# Patient Record
Sex: Male | Born: 1970 | Race: White | Hispanic: Yes | Marital: Married | Smoking: Never smoker
Health system: Southern US, Community
[De-identification: ages and names within clinical notes are randomized; demographics above are authoritative.]

## PROBLEM LIST (undated history)

## (undated) ENCOUNTER — Ambulatory Visit: Admission: EM | Source: Home / Self Care

## (undated) DIAGNOSIS — K603 Anal fistula, unspecified: Secondary | ICD-10-CM

---

## 2007-12-09 ENCOUNTER — Emergency Department (HOSPITAL_COMMUNITY): Admission: EM | Admit: 2007-12-09 | Discharge: 2007-12-09 | Payer: Self-pay | Admitting: Emergency Medicine

## 2007-12-30 ENCOUNTER — Emergency Department (HOSPITAL_COMMUNITY): Admission: EM | Admit: 2007-12-30 | Discharge: 2007-12-30 | Payer: Self-pay | Admitting: Emergency Medicine

## 2008-01-11 ENCOUNTER — Encounter: Admission: RE | Admit: 2008-01-11 | Discharge: 2008-01-11 | Payer: Self-pay | Admitting: Chiropractic Medicine

## 2010-03-11 ENCOUNTER — Emergency Department (HOSPITAL_COMMUNITY)
Admission: EM | Admit: 2010-03-11 | Discharge: 2010-03-11 | Disposition: A | Discharge status: Home / Self Care | Payer: Self-pay | Attending: Emergency Medicine | Admitting: Emergency Medicine

## 2010-03-11 DIAGNOSIS — R51 Headache: Secondary | ICD-10-CM | POA: Insufficient documentation

## 2010-03-11 DIAGNOSIS — J329 Chronic sinusitis, unspecified: Secondary | ICD-10-CM | POA: Insufficient documentation

## 2010-03-30 ENCOUNTER — Emergency Department (HOSPITAL_COMMUNITY)
Admission: EM | Admit: 2010-03-30 | Discharge: 2010-03-30 | Disposition: A | Discharge status: Home / Self Care | Payer: Self-pay | Attending: Emergency Medicine | Admitting: Emergency Medicine

## 2010-03-30 DIAGNOSIS — J329 Chronic sinusitis, unspecified: Secondary | ICD-10-CM | POA: Insufficient documentation

## 2010-03-30 DIAGNOSIS — R51 Headache: Secondary | ICD-10-CM | POA: Insufficient documentation

## 2010-03-30 DIAGNOSIS — R220 Localized swelling, mass and lump, head: Secondary | ICD-10-CM | POA: Insufficient documentation

## 2010-03-30 DIAGNOSIS — J3489 Other specified disorders of nose and nasal sinuses: Secondary | ICD-10-CM | POA: Insufficient documentation

## 2010-04-03 IMAGING — CR DG WRIST COMPLETE 3+V*L*
4 series · 4 of 4 positions shown · non-contrast
Comparison: 12/09/2007

CLINICAL DATA: Injury to left wrist.

LEFT WRIST - COMPLETE 3+ VIEW

[x wrist pa left]
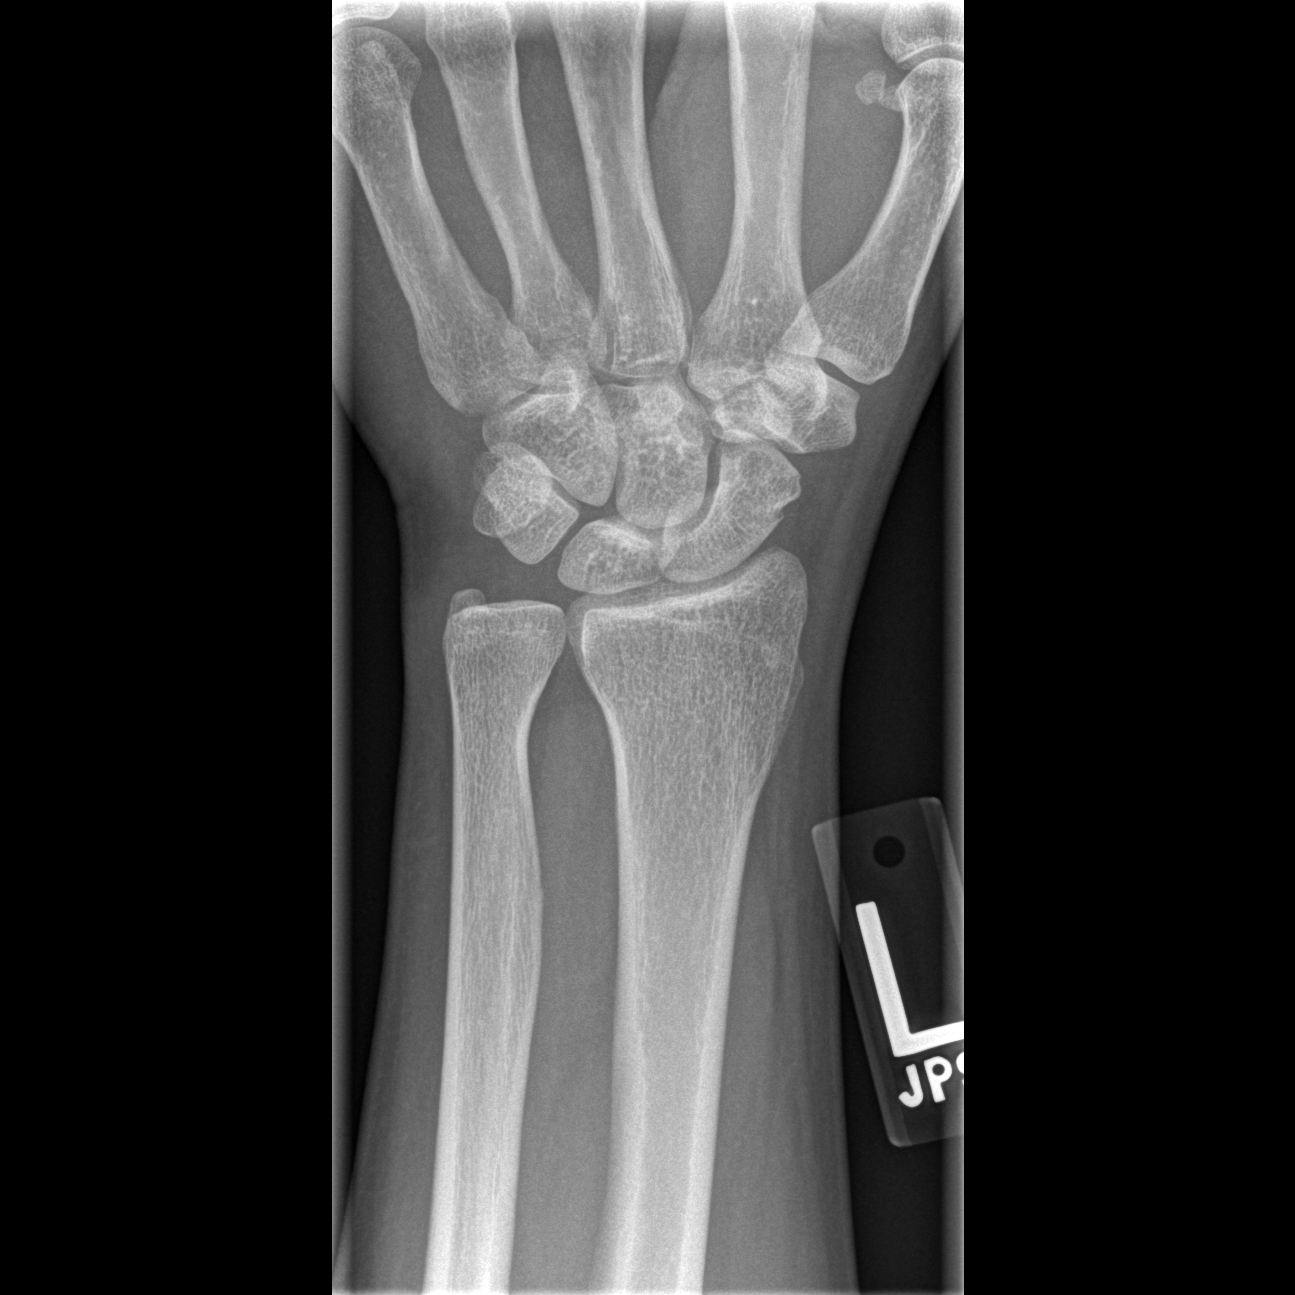

[x wrist obl left]
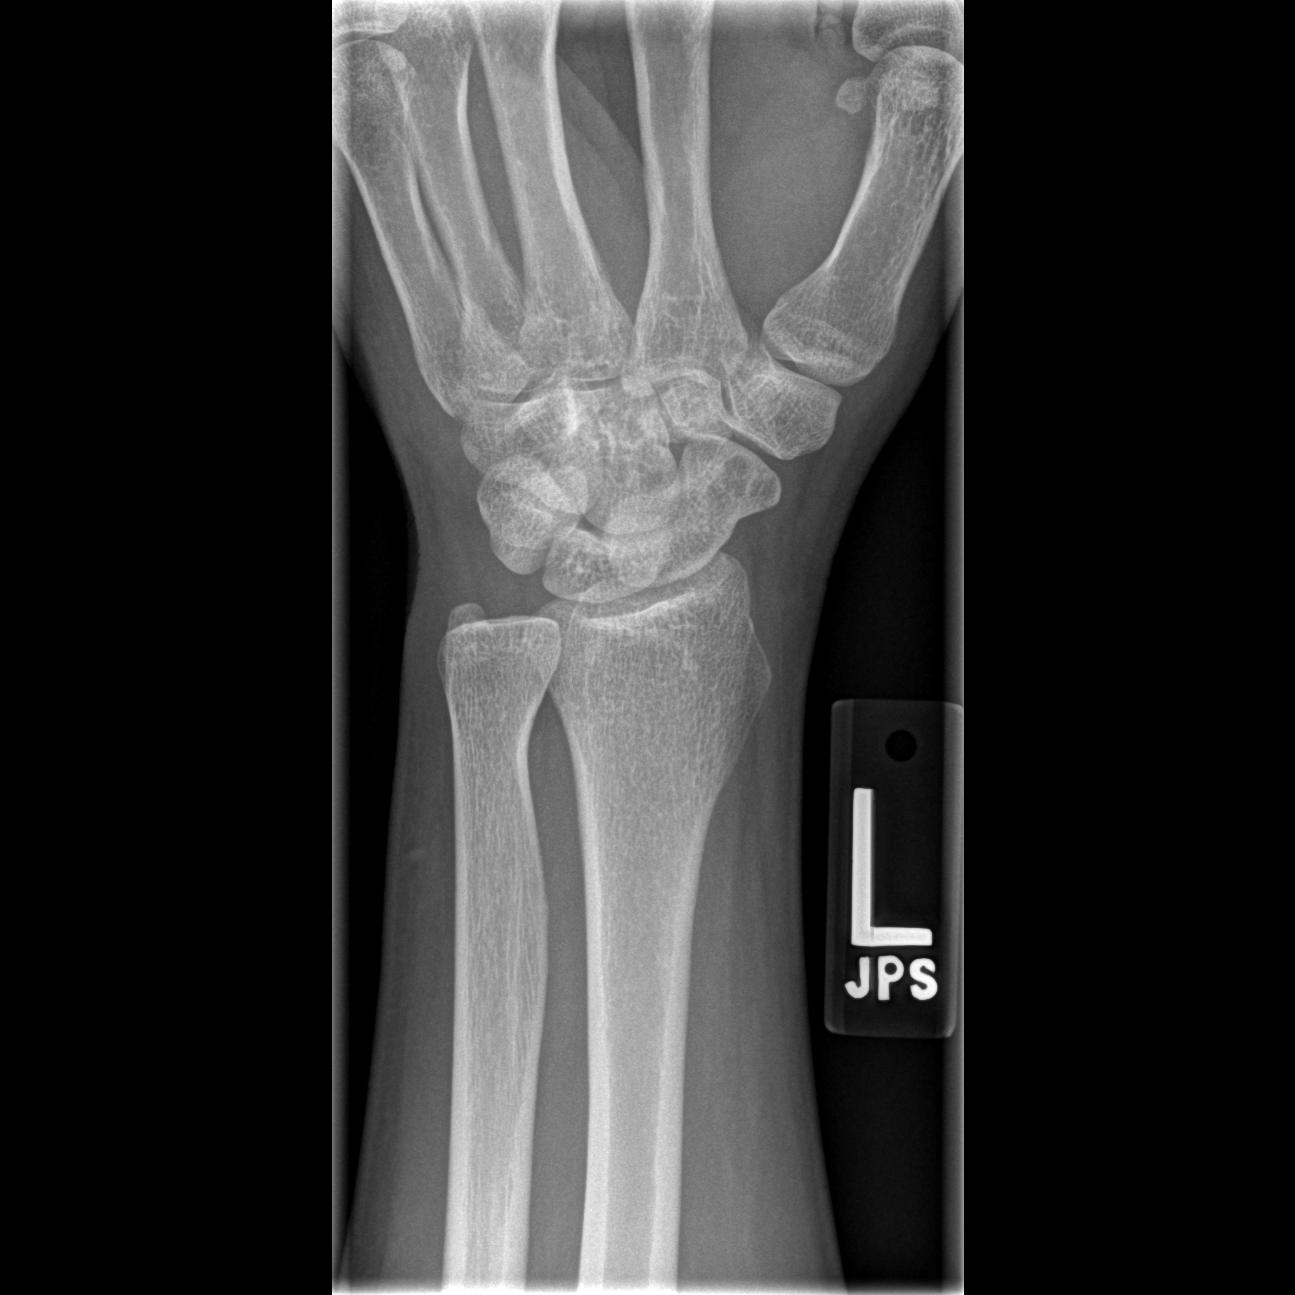

[x wrist lat left]
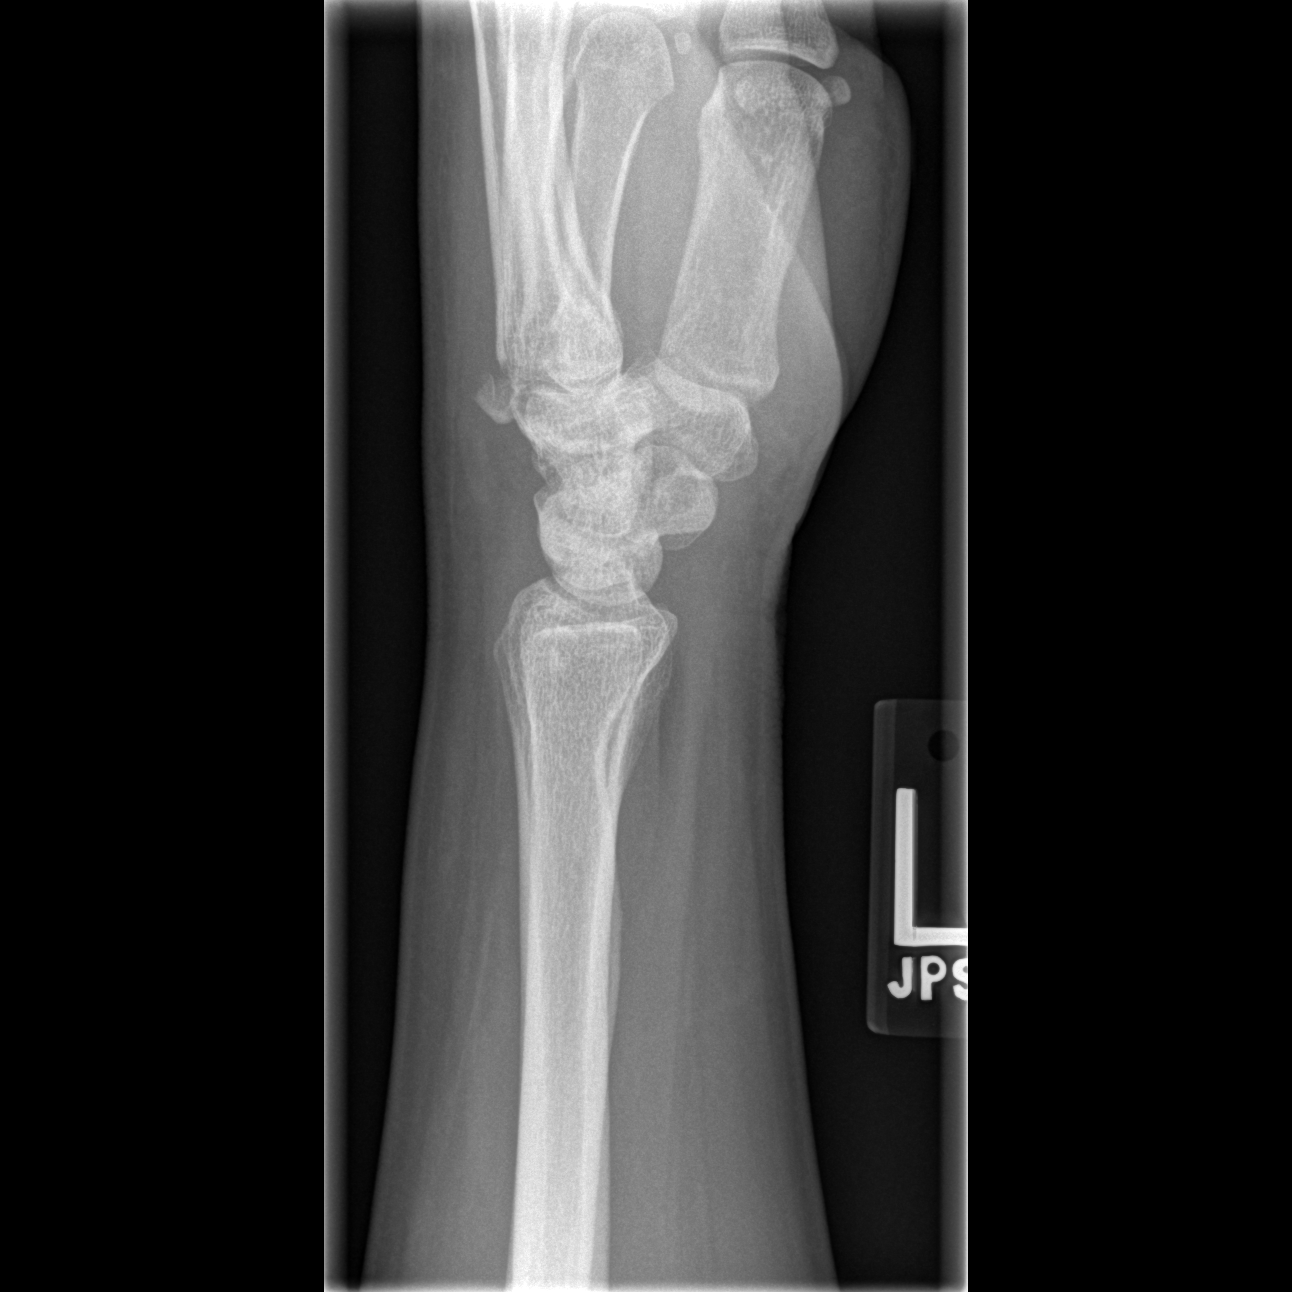

[x navicular]
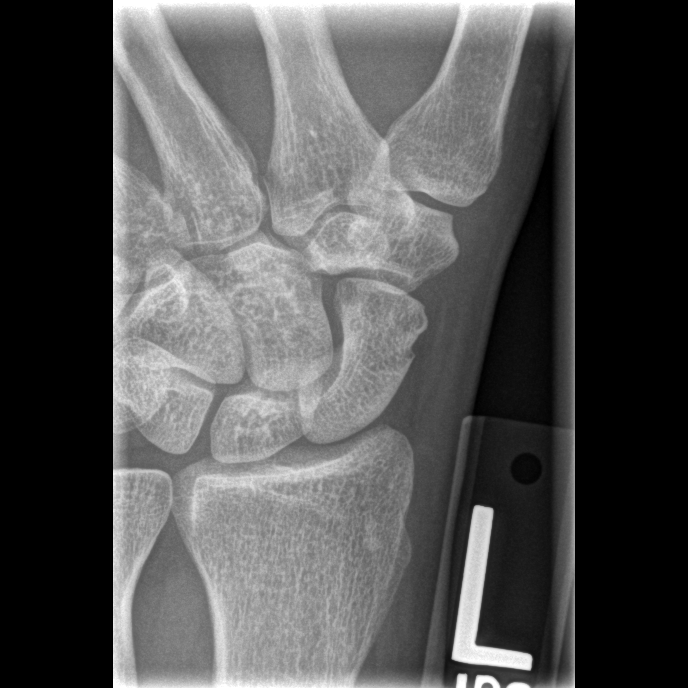

[4 of 4 positions shown; findings below may reference images not displayed]

FINDINGS: No evidence of acute fracture, subluxation or dislocation
identified.

No radio-opaque foreign bodies are present.

No focal bony lesions are noted.

There has been little interval change since the prior study.
IMPRESSION: No evidence of acute bony abnormality.

## 2010-05-21 ENCOUNTER — Emergency Department (HOSPITAL_COMMUNITY): Payer: Self-pay

## 2010-05-21 ENCOUNTER — Emergency Department (HOSPITAL_COMMUNITY)
Admission: EM | Admit: 2010-05-21 | Discharge: 2010-05-21 | Disposition: A | Discharge status: Home / Self Care | Payer: Self-pay | Attending: Emergency Medicine | Admitting: Emergency Medicine

## 2010-05-21 DIAGNOSIS — K089 Disorder of teeth and supporting structures, unspecified: Secondary | ICD-10-CM | POA: Insufficient documentation

## 2010-05-21 DIAGNOSIS — R51 Headache: Secondary | ICD-10-CM | POA: Insufficient documentation

## 2010-05-21 DIAGNOSIS — J329 Chronic sinusitis, unspecified: Secondary | ICD-10-CM | POA: Insufficient documentation

## 2010-10-22 LAB — URINALYSIS, ROUTINE W REFLEX MICROSCOPIC
Glucose, UA: NEGATIVE
Ketones, ur: NEGATIVE
Nitrite: NEGATIVE
Specific Gravity, Urine: 1.013

## 2010-10-22 LAB — POCT I-STAT, CHEM 8
BUN: 7
Calcium, Ion: 1.2
HCT: 47
Potassium: 4

## 2010-10-24 LAB — COMPREHENSIVE METABOLIC PANEL
CO2: 26 mEq/L (ref 19–32)
Calcium: 9.7 mg/dL (ref 8.4–10.5)
Sodium: 139 mEq/L (ref 135–145)
Total Bilirubin: 0.8 mg/dL (ref 0.3–1.2)
Total Protein: 7.8 g/dL (ref 6.0–8.3)

## 2010-10-24 LAB — DIFFERENTIAL
Basophils Absolute: 0 10*3/uL (ref 0.0–0.1)
Basophils Relative: 0 % (ref 0–1)
Eosinophils Absolute: 0.1 K/uL (ref 0.0–0.7)
Eosinophils Relative: 1 % (ref 0–5)
Lymphocytes Relative: 22 % (ref 12–46)
Lymphs Abs: 1.9 10*3/uL (ref 0.7–4.0)
Monocytes Absolute: 0.6 10*3/uL (ref 0.1–1.0)
Monocytes Relative: 7 % (ref 3–12)
Neutro Abs: 6 10*3/uL (ref 1.7–7.7)
Neutrophils Relative %: 69 % (ref 43–77)

## 2010-10-24 LAB — CBC
HCT: 48.8 % (ref 39.0–52.0)
Hemoglobin: 16.9 g/dL (ref 13.0–17.0)
MCHC: 34.6 g/dL (ref 30.0–36.0)
MCV: 91.3 fL (ref 78.0–100.0)
Platelets: 247 K/uL (ref 150–400)
RBC: 5.34 MIL/uL (ref 4.22–5.81)
RDW: 12.4 % (ref 11.5–15.5)
WBC: 8.6 10*3/uL (ref 4.0–10.5)

## 2010-10-24 LAB — URINALYSIS, ROUTINE W REFLEX MICROSCOPIC
Bilirubin Urine: NEGATIVE
Glucose, UA: NEGATIVE mg/dL
Ketones, ur: NEGATIVE mg/dL
Nitrite: NEGATIVE
Specific Gravity, Urine: 1.02 (ref 1.005–1.030)
pH: 6 (ref 5.0–8.0)

## 2015-05-08 ENCOUNTER — Ambulatory Visit (INDEPENDENT_AMBULATORY_CARE_PROVIDER_SITE_OTHER): Payer: Self-pay | Admitting: Physician Assistant

## 2015-05-08 VITALS — BP 126/100 | HR 68 | Temp 98.0°F | Resp 16 | Ht 66.0 in | Wt 207.8 lb

## 2015-05-08 DIAGNOSIS — D649 Anemia, unspecified: Secondary | ICD-10-CM

## 2015-05-08 DIAGNOSIS — K625 Hemorrhage of anus and rectum: Secondary | ICD-10-CM

## 2015-05-08 LAB — POCT CBC
GRANULOCYTE PERCENT: 66.2 % (ref 37–80)
HEMATOCRIT: 42.7 % — AB (ref 43.5–53.7)
Hemoglobin: 15.5 g/dL (ref 14.1–18.1)
Lymph, poc: 2.1 (ref 0.6–3.4)
MCH: 31.9 pg — AB (ref 27–31.2)
MCHC: 36.3 g/dL — AB (ref 31.8–35.4)
MCV: 87.9 fL (ref 80–97)
MID (cbc): 0.6 (ref 0–0.9)
MPV: 7.6 fL (ref 0–99.8)
PLATELET COUNT, POC: 179 10*3/uL (ref 142–424)
POC GRANULOCYTE: 5.2 (ref 2–6.9)
POC LYMPH %: 26.5 % (ref 10–50)
POC MID %: 7.3 %M (ref 0–12)
RBC: 4.85 M/uL (ref 4.69–6.13)
RDW, POC: 12.7 %
WBC: 7.8 10*3/uL (ref 4.6–10.2)

## 2015-05-08 LAB — POCT SEDIMENTATION RATE: POCT SED RATE: 16 mm/h (ref 0–22)

## 2015-05-08 MED ORDER — HYDROCORTISONE ACETATE 25 MG RE SUPP: 25.0000 mg | Freq: Two times a day (BID) | RECTAL | Status: DC

## 2015-05-08 NOTE — Patient Instructions (Signed)
     IF you received an x-ray today, you will receive an invoice from [facility]. Please contact [facility] at [phone] with questions or concerns regarding your invoice.   IF you received labwork today, you will receive an invoice from [facility]. Please contact [facility] at [phone] with questions or concerns regarding your invoice.   Our billing staff will not be able to assist you with questions regarding bills from these companies.  You will be contacted with the lab results as soon as they are available. The fastest way to get your results is to activate your My Chart account. Instructions are located on the last page of this paperwork. If you have not heard from us regarding the results in 2 weeks, please contact this office.      

## 2015-05-08 NOTE — Progress Notes (Signed)
05/08/2015 4:58 PM   DOB: 11/06/1970 / MRN: [id number]  SUBJECTIVE:  Sharlyn BolognaJose E Espitia is a 45 y.o. self pay male presenting for rectal bleeding that started three days ago. Reports the both the paper and the toilet bowel are red after defecating, and states his defecation has increased to 10 times per day. Associates pain with defecation.  He denies a recent history of constipation.  Denies taking any NSAIDS in the last month.  Denies heavy alcohol consumption. Denies nausea, abdominal pain, and dysuria.  He is a non smoker.   He has No Known Allergies.   He  has no past medical history on file.    He  reports that he has never smoked. He does not have any smokeless tobacco history on file. He reports that he does not drink alcohol. He  has no sexual activity history on file. The patient  has no past surgical history on file.  His family history is not on file.  Review of Systems  Constitutional: Negative for fever.  Gastrointestinal: Positive for blood in stool. Negative for heartburn, nausea, vomiting, abdominal pain, diarrhea, constipation and melena.  Skin: Negative for rash.  Neurological: Negative for dizziness.    Problem list and medications reviewed and updated by myself where necessary, and exist elsewhere in the encounter.   OBJECTIVE:  BP 126/100 mmHg  Pulse 68  Temp(Src) 98 F (36.7 C) (Oral)  Resp 16  Ht 5\' 6"  (1.676 m)  Wt 207 lb 12.8 oz (94.257 kg)  BMI 33.56 kg/m2  SpO2 98%  Physical Exam  Constitutional: He is oriented to person, place, and time. He appears well-developed. He does not appear ill.  Eyes: Conjunctivae and EOM are normal. Pupils are equal, round, and reactive to light.  Cardiovascular: Normal rate.   Pulmonary/Chest: Effort normal.  Abdominal: He exhibits no distension.  Genitourinary: Rectal exam shows tenderness. Rectal exam shows no external hemorrhoid, no internal hemorrhoid, no fissure, no mass and anal tone normal.     Musculoskeletal:  Normal range of motion.  Neurological: He is alert and oriented to person, place, and time. No cranial nerve deficit. Coordination normal.  Skin: Skin is warm and dry. He is not diaphoretic.  Psychiatric: He has a normal mood and affect.  Nursing note and vitals reviewed.   Results for orders placed or performed in visit on 05/08/15 (from the past 72 hour(s))  POCT CBC     Status: Abnormal   Collection Time: 05/08/15  4:25 PM  Result Value Ref Range   WBC 7.8 4.6 - 10.2 K/uL   Lymph, poc 2.1 0.6 - 3.4   POC LYMPH PERCENT 26.5 10 - 50 %L   MID (cbc) 0.6 0 - 0.9   POC MID % 7.3 0 - 12 %M   POC Granulocyte 5.2 2 - 6.9   Granulocyte percent 66.2 37 - 80 %G   RBC 4.85 4.69 - 6.13 M/uL   Hemoglobin 15.5 14.1 - 18.1 g/dL   HCT, POC 40.942.7 (A) 81.143.5 - 53.7 %   MCV 87.9 80 - 97 fL   MCH, POC 31.9 (A) 27 - 31.2 pg   MCHC 36.3 (A) 31.8 - 35.4 g/dL   RDW, POC 91.412.7 %   Platelet Count, POC 179 142 - 424 K/uL   MPV 7.6 0 - 99.8 fL    No results found.  ASSESSMENT AND PLAN  Elita QuickJose was seen today for blood in stool and anal pain.  Diagnoses and all orders for  this visit:  Rectal bleeding: He is a non smoker and drinks very little.  No NSAIDs in the last month.  His platelets are normal. He has been having frank hematochezia. He has rectal pain and his rectal bleeding is copious in the morning.  I can not see or palpate any hemorrhoid or fissure. Given his complaint of rectal pain I am inclined to think that this is not a concerning bleed.  I am checking a sed rate today and will recheck his CBC tomorrow to obtain a direction of his bleeding.  If he is improved with Anusol then I feel this is most likely a hemorrhoid that I can not appreciate.  If his CBC is dropping will stat him to GI for a scope.   -     POCT CBC -     POCT SEDIMENTATION RATE -     hydrocortisone (ANUSOL-HC) 25 MG suppository; Place 1 suppository (25 mg total) rectally 2 (two) times daily.  Anemia, unspecified anemia type: See  problem one.   -     POCT CBC; Future    The patient was advised to call or return to clinic if he does not see an improvement in symptoms or to seek the care of the closest emergency department if he worsens with the above plan.   Deliah Boston, MHS, PA-C [facility] 05/08/2015 4:58 PM

## 2015-05-09 ENCOUNTER — Ambulatory Visit: Payer: Self-pay | Admitting: Physician Assistant

## 2015-05-09 DIAGNOSIS — D649 Anemia, unspecified: Secondary | ICD-10-CM

## 2015-05-09 LAB — POCT CBC
GRANULOCYTE PERCENT: 60.5 % (ref 37–80)
HEMATOCRIT: 44.3 % (ref 43.5–53.7)
Hemoglobin: 16.2 g/dL (ref 14.1–18.1)
Lymph, poc: 2 (ref 0.6–3.4)
MCH: 32.1 pg — AB (ref 27–31.2)
MCHC: 36.5 g/dL — AB (ref 31.8–35.4)
MCV: 87.9 fL (ref 80–97)
MID (CBC): 0.6 (ref 0–0.9)
MPV: 7.6 fL (ref 0–99.8)
PLATELET COUNT, POC: 185 10*3/uL (ref 142–424)
POC GRANULOCYTE: 4 (ref 2–6.9)
POC LYMPH %: 31 % (ref 10–50)
POC MID %: 8.5 %M (ref 0–12)
RBC: 5.04 M/uL (ref 4.69–6.13)
RDW, POC: 12.2 %
WBC: 6.6 10*3/uL (ref 4.6–10.2)

## 2015-05-09 NOTE — Patient Instructions (Signed)
     IF you received an x-ray today, you will receive an invoice from [facility]. Please contact [facility] at [phone] with questions or concerns regarding your invoice.   IF you received labwork today, you will receive an invoice from [facility]. Please contact [facility] at [phone] with questions or concerns regarding your invoice.   Our billing staff will not be able to assist you with questions regarding bills from these companies.  You will be contacted with the lab results as soon as they are available. The fastest way to get your results is to activate your My Chart account. Instructions are located on the last page of this paperwork. If you have not heard from us regarding the results in 2 weeks, please contact this office.      

## 2015-05-09 NOTE — Progress Notes (Signed)
He is here for a blood draw only. CBC improved.  Advised he continue taking the anusol.  Will see him back in 1 week for an appointment. Deliah BostonMichael Dorsey Authement, MS, PA-C 12:46 PM, 05/09/2015

## 2015-07-24 ENCOUNTER — Ambulatory Visit (INDEPENDENT_AMBULATORY_CARE_PROVIDER_SITE_OTHER): Payer: Self-pay | Admitting: Family Medicine

## 2015-07-24 VITALS — BP 118/72 | HR 69 | Temp 97.8°F | Resp 18 | Ht 68.5 in | Wt 209.0 lb

## 2015-07-24 DIAGNOSIS — H6122 Impacted cerumen, left ear: Secondary | ICD-10-CM

## 2015-07-24 NOTE — Progress Notes (Addendum)
By signing my name below I, Shelah LewandowskyJoseph Thomas, attest that this documentation has been prepared under the direction and in the presence of Shade FloodJeffrey R Iniya Matzek, MD. Electonically Signed. Shelah LewandowskyJoseph Thomas, Scribe 07/24/2015 at 2:21 PM  Subjective:    Patient ID: Nathan Hickman, male    DOB: 12/05/1970, 45 y.o.   MRN: [id number]  Chief Complaint  Patient presents with  . Ear Problem    Left ear.     HPI Nathan Hickman is a 45 y.o. male who [facility] complaining of left ear ringing and decreased hearing that started yesterday and has been improving and not resolved. Pt was playing in a pool with his son yesterday and has difficulty hearing out of his left ear since then. Pt also describes a full feeling in left ear and thought there was water stuck in his ear. Pt used a que tip yesterday and removed some ear wax. Pt denies any ear pain while using que tip or afterwards. Pt's hearing was improved after the was was removed and the ringing was reduced.  Pt denies any fever, chills, or cough.     There are no active problems to display for this patient.  No past medical history on file. No past surgical history on file. No Known Allergies Prior to Admission medications   Not on File   Social History   Social History  . Marital Status: Married    Spouse Name: N/A  . Number of Children: N/A  . Years of Education: N/A   Occupational History  . Not on file.   Social History Main Topics  . Smoking status: Never Smoker   . Smokeless tobacco: Not on file  . Alcohol Use: No  . Drug Use: Not on file  . Sexual Activity: Not on file   Other Topics Concern  . Not on file   Social History Narrative      Review of Systems  Constitutional: Negative for fever and chills.  HENT: Positive for hearing loss (left ear). Negative for ear pain.   Respiratory: Negative for cough.        Objective:   Physical Exam  Constitutional: He is oriented to person,  place, and time. He appears well-developed and well-nourished. No distress.  HENT:  Head: Normocephalic and atraumatic.  Right Ear: Hearing, tympanic membrane, external ear and ear canal normal.  There is a small amount of yellow cerumen in the left proximal canal with one black hair, Can visualize part of left TM which does not appear to be ruptured. No tenderness with motion of pinna. There is no swelling, exudate, or redness of canal. Gross hearing intact.   Eyes: Conjunctivae are normal. Pupils are equal, round, and reactive to light.  Neck: Neck supple.  Cardiovascular: Normal rate.   Pulmonary/Chest: Effort normal.  Musculoskeletal: Normal range of motion.  Neurological: He is alert and oriented to person, place, and time.  Skin: Skin is warm and dry. No rash noted.  Psychiatric: He has a normal mood and affect. His behavior is normal.  Nursing note and vitals reviewed.    Filed Vitals:   07/24/15 1403  BP: 118/72  Pulse: 69  Temp: 97.8 F (36.6 C)  TempSrc: Oral  Resp: 18  Height: 5' 8.5" (1.74 m)  Weight: 209 lb (94.802 kg)  SpO2: 99%        Assessment & Plan:   Nathan Hickman is a 45 y.o. male Excessive cerumen  in ear canal, left  Suspected excess cerumen worsened by swimming. Appears to have cleared most of cerumen and blockage with a cotton tipped swab yesterday,, but did advise against using these in the future as risk of rupture or canal damage/infection. There was minimal cerumen and small amount of clear fluid at the proximal canal, without other signs of infection, edema, or apparent TM rupture on my exam. However advised if he is not improving the next 3-4 days, including with improvement in hearing, to return for recheck. Spanish and [place] spoken with understanding expressed.  No orders of the defined types were placed in this encounter.   Patient Instructions       IF you received an x-ray today, you will receive an invoice [facility]. Please contact [facility] at [phone] with questions or concerns regarding your invoice.   IF you received labwork today, you will receive an invoice from [facility]. Please contact [facility] at [phone] with questions or concerns regarding your invoice.   Our billing staff will not be able to assist you with questions regarding bills from these companies.  You will be contacted with the lab results as soon as they are available. The fastest way to get your results is to activate your My Chart account. Instructions are located on the last page of this paperwork. If you have not heard from us regarding the results in 2 weeks, please contact this office.     No mas Qtips, porque es posible rompio un parte de su oido. si no esta mejor en proximo 3-4 dias - regrese. regrese mas temprano si empeorse (dolor, mas ruido, o no oi bien).   Tapn de cerumen (Cerumen Impaction) Las estructuras del canal auditivo externo secretan una sustancia cerosa llamada cerumen. El exceso de cerumen puede acumularse en el canal auditivo y causar una afeccin conocida como tapn de cerumen. El tapn de cerumen puede producir dolor de odo y Media planneralterar el funcionamiento de este rgano. La velocidad de produccin del cerumen es diferente en cada persona. En algunas, la estructura del canal auditivo puede reducir su capacidad de eliminar el cerumen de forma natural. CAUSAS La causa del tapn de cerumen es el exceso de produccin o la acumulacin de esta secrecin. FACTORES DE RIESGO  Usar hisopos frecuentemente para limpiarse los odos.  Tener los canales TEPPCO Partnersauditivos estrechos.  Tener eccema.  Estar deshidratado. SIGNOS Y SNTOMAS  Disminucin de la audicin.  Secrecin del odo.  Dolor de odo.  Picazn en el odo. TRATAMIENTO El tratamiento puede incluir lo siguiente:  Gotas ticas de venta libre o recetadas para ablandar el cerumen.  Extraccin del  cerumen a cargo de Games developerun mdico. Esto se puede hacer de las siguientes maneras:  Lavado con agua tibia. Este es el mtodo de extraccin ms comn.  Cucharillas de raspado y otros instrumentos para el odo.  Ciruga. Esta puede realizarse Circuit Citycuando los casos son graves. INSTRUCCIONES PARA EL CUIDADO EN EL HOGAR  Tome los medicamentos solamente como se lo haya indicado el mdico.  No se introduzca objetos en el odo con la intencin de limpiarlo. PREVENCIN  No se introduzca objetos en el odo, aunque sea con la intencin de limpiarlo. No es necesario quitar el cerumen como parte de la higiene normal, y no se recomienda el uso de hisopos en el canal auditivo.  Beba suficiente agua para mantener la orina clara o de color amarillo plido.  Controle el eccema, si lo tiene. SOLICITE ATENCIN MDICA SI:  Tiene dolor de odo.  Le sangra el odo.  El cerumen no se elimina despus de colocarse gotas ticas como se lo indicaron.   Esta informacin no tiene Theme park manager el consejo del mdico. Asegrese de hacerle al mdico cualquier pregunta que tenga.   Document Released: 01/05/2005 Document Revised: 01/26/2014 Elsevier Interactive Patient Education [employer]      I personally performed the services described in this documentation, which was scribed in my presence. The recorded information has been reviewed and considered, and addended by me as needed.   Signed,   Meredith Staggers, MD [facility].  07/24/2015 2:31 PM

## 2015-07-24 NOTE — Patient Instructions (Addendum)
     IF you received an x-ray today, you will receive an invoice from [facility]. Please contact [facility] at [phone] with questions or concerns regarding [facility].   IF you received labwork today, you will receive an invoice from [facility]. Please contact [facility] at [phone] with questions or concerns regarding your invoice.   Our billing staff will not be able to assist you with questions regarding bills from these companies.  You will be contacted with the lab results as soon as they are available. The fastest way to get your results is to activate your My Chart account. Instructions are located on the last page of this paperwork. If you have not heard from us regarding the results in 2 weeks, please contact this office.     No mas Qtips, porque es posible rompio un parte de su oido. si no esta mejor en proximo 3-4 dias - regrese. regrese mas temprano si empeorse (dolor, mas ruido, o no oi bien).   Tapn de cerumen (Cerumen Impaction) Las estructuras del canal auditivo externo secretan una sustancia cerosa llamada cerumen. El exceso de cerumen puede acumularse en el canal auditivo y causar una afeccin conocida como tapn de cerumen. El tapn de cerumen puede producir dolor de odo y Media planneralterar el funcionamiento de este rgano. La velocidad de produccin del cerumen es diferente en cada persona. En algunas, la estructura del canal auditivo puede reducir su capacidad de eliminar el cerumen de forma natural. CAUSAS La causa del tapn de cerumen es el exceso de produccin o la acumulacin de esta secrecin. FACTORES DE RIESGO  Usar hisopos frecuentemente para limpiarse los odos.  Tener los canales TEPPCO Partnersauditivos estrechos.  Tener eccema.  Estar deshidratado. SIGNOS Y SNTOMAS  Disminucin de la audicin.  Secrecin del odo.  Dolor de odo.  Picazn en el odo. TRATAMIENTO El tratamiento puede incluir lo  siguiente:  Gotas ticas de venta libre o recetadas para ablandar el cerumen.  Extraccin del cerumen a cargo de Games developerun mdico. Esto se puede hacer de las siguientes maneras:  Lavado con agua tibia. Este es el mtodo de extraccin ms comn.  Cucharillas de raspado y otros instrumentos para el odo.  Ciruga. Esta puede realizarse [employer] Citycuando los casos son graves. INSTRUCCIONES PARA EL CUIDADO EN EL HOGAR  Tome los medicamentos solamente como se lo haya indicado el mdico.  No se introduzca objetos en el odo con la intencin de limpiarlo. PREVENCIN  No se introduzca objetos en el odo, aunque sea con la intencin de limpiarlo. No es necesario quitar el cerumen como parte de la higiene normal, y no se recomienda el uso de hisopos en el canal auditivo.  Beba suficiente agua para mantener la orina clara o de color amarillo plido.  Controle el eccema, si lo tiene. SOLICITE ATENCIN MDICA SI:  Tiene dolor de odo.  Le sangra el odo.  El cerumen no se elimina despus de colocarse gotas ticas como se lo indicaron.   Esta informacin no tiene Theme park managercomo fin reemplazar el consejo del mdico. Asegrese de hacerle al mdico cualquier pregunta que tenga.   Document Released: 01/05/2005 Document Revised: 01/26/2014 Elsevier Interactive Patient Education [employer]2016 Elsevier Inc.

## 2015-07-29 ENCOUNTER — Ambulatory Visit (INDEPENDENT_AMBULATORY_CARE_PROVIDER_SITE_OTHER): Payer: Self-pay | Admitting: Physician Assistant

## 2015-07-29 VITALS — BP 116/70 | HR 68 | Temp 97.7°F | Resp 18 | Ht 68.5 in | Wt 211.4 lb

## 2015-07-29 DIAGNOSIS — R635 Abnormal weight gain: Secondary | ICD-10-CM

## 2015-07-29 LAB — POCT GLYCOSYLATED HEMOGLOBIN (HGB A1C): HEMOGLOBIN A1C: 5.6

## 2015-07-29 LAB — TSH: TSH: 2.65 m[IU]/L (ref 0.40–4.50)

## 2015-07-29 LAB — GLUCOSE, POCT (MANUAL RESULT ENTRY): POC GLUCOSE: 91 mg/dL (ref 70–99)

## 2015-07-29 NOTE — Progress Notes (Signed)
[facility] [address], [place] [id number] [phone]  Date:  07/29/2015   Name:  Nathan Hickman   DOB:  07/09/1970   MRN:  [id number]  PCP:  No PCP Per Patient    History of Present Illness:  Nathan Hickman is a 45 y.o. male patient who [facility]   --running every day and was weighing 206lbs.  Lost 22 lbs 2 years ago.   --He is no longer exercising.   --Breakfast: cereal, and water.  H[place] CorkJimmy John, or 4 tacos.  No sodas, no sweet teas.  Red bull 1-2.  No desserts --painting.   --qd.  No blood in stool.  No constipation. --No diarrhea, no frequency, no nausea, no dizziness, no tremulousness.     Wt Readings from Last 3 Encounters:  07/29/15 211 lb 6.4 oz (95.89 kg)  07/24/15 209 lb (94.802 kg)  05/09/15 207 lb (93.895 kg)     There are no active problems to display for this patient.   History reviewed. No pertinent past medical history.  History reviewed. No pertinent past surgical history.  Social History  Substance Use Topics  . Smoking status: Never Smoker   . Smokeless tobacco: None  . Alcohol Use: No    History reviewed. No pertinent family history.  No Known Allergies  Medication list has been reviewed and updated.  No current outpatient prescriptions on file prior to visit.   No current facility-administered medications on file prior to visit.    ROS ROS otherwise unremarkable unless listed above.   Physical Examination: BP 116/70 mmHg  Pulse 68  Temp(Src) 97.7 F (36.5 C) (Oral)  Resp 18  Ht 5' 8.5" (1.74 m)  Wt 211 lb 6.4 oz (95.89 kg)  BMI 31.67 kg/m2  SpO2 99% Ideal Body Weight: Weight in (lb) to have BMI = 25: 166.5  Physical Exam  Constitutional: He is oriented to person, place, and time. He appears well-developed and well-nourished. No distress.  HENT:  Head: Normocephalic and atraumatic.  Eyes: Conjunctivae and EOM are normal. Pupils are equal, round, and reactive to light.  Neck: No thyromegaly  present.  Cardiovascular: Normal rate and regular rhythm.  Exam reveals no friction rub.   No murmur heard. Pulmonary/Chest: Effort normal and breath sounds normal. No respiratory distress.  Musculoskeletal: He exhibits no edema.  Lymphadenopathy:    He has no cervical adenopathy.  Neurological: He is alert and oriented to person, place, and time.  Skin: Skin is warm and dry. He is not diaphoretic.  Psychiatric: He has a normal mood and affect. His behavior is normal.     Assessment and Plan: Nathan Hickman is a 45 y.o. male who is here today for weight gain. He reports concerns of weight gain. Below labs performed today. Advised exercise regimen and diet.  Follow up warranted pending results.   Weight gain - Plan: TSH, POCT glucose (manual entry), POCT glycosylated hemoglobin (Hb A1C), Lipid panel  Trena PlattStephanie Travin Marik, PA-C [facility] 07/29/2015 3:19 PM

## 2015-07-29 NOTE — Patient Instructions (Addendum)
IF you received an x-ray today, you will receive an invoice from [facility]. Please contact [facility] at [phone] with questions or concerns regarding your invoice.   IF you received labwork today, you will receive an invoice from [employer]. Please contact [facility] at [phone] with questions or concerns regarding your invoice.   Our billing staff will not be able to assist you with questions regarding bills from these companies.  You will be contacted with the lab results as soon as they are available. The fastest way to get your results is to activate your My Chart account. Instructions are located on the last page of this paperwork. If you have not heard from [place] regarding the results in 2 weeks, please contact this office.    'Please return for the lipid check.  I will have your results within the next 7-10 days.   Blood sugar is fine at this time.  Hacer ejercicio para Psychiatric nurse (Exercising to [employer]) Hacer ejercicio puede ayudarlo a bajar de peso. Para bajar de [employer] ejercicio, este debe ser de intensidad vigorosa. Puede saber que est haciendo ejercicio de intensidad vigorosa si respira con mucha dificultad y rapidez, y no puede mantener una conversacin. El ejercicio de intensidad moderada ayuda a Radio producer peso actual. Puede saber que est haciendo ejercicio de intensidad moderada si tiene una frecuencia cardaca ms elevada y [place] respiracin ms rpida, pero an puede Diplomatic Services operational officer. CON QU FRECUENCIA DEBO HACER EJERCICIO? Elija una actividad que disfrute y establezca objetivos realistas. El mdico puede ayudarlo a Event organiser un plan de actividades que funcione para usted. Haga ejercicio regularmente como se lo haya indicado el mdico. Esta puede incluir:  Programme researcher, broadcasting/film/video de resistencia dos veces por semana, como:  Flexiones de [place].  Abdominales.  Levantamiento de  pesas.  Ejercicios con bandas elsticas.  Realizar una intensidad determinada de ejercicio durante una cantidad determinada de [place]. Elija entre estas opciones:  de ejercicio de intensidad moderada cada semana.  de ejercicio de intensidad vigorosa cada semana.  Burlene Arnt de ejercicio de intensidad moderada y vigorosa cada semana. Los nios, las mujeres [place], las personas que no estn en forma, las personas con sobrepeso y los adultos mayores tal vez tengan que consultar a un mdico para que les d Medical laboratory scientific officer. Si tiene [employer], asegrese de Science writer al mdico antes de comenzar un programa de ejercicios nuevo. CULES SON ALGUNAS ACTIVIDADES QUE PUEDEN AYUDARME A BAJAR DE PESO?   Caminar a un ritmo de al menos 4,45millas (7kilmetros) por hora.  Trotar o correr a un ritmo de 5 millas (8 kilmetros) por hora.  Andar en bicicleta a un ritmo de al menos 10 millas (16 kilmetros) por hora.  Practicar natacin.  Practicar patinaje sobre ruedas normales o en lnea.  Hacer esqu de fondo.  Hacer deportes competitivos vigorosos, como ftbol americano, bsquet y ftbol.  Saltar la soga.  Tomar clases de baile aerbico. CMO PUEDO SER MS ACTIVO EN MIS ACTIVIDADES DIARIAS?  Utilice las Microbiologist del ascensor.  D una caminata durante su hora de almuerzo.  Si conduce, estacione el automvil ms lejos del trabajo o de la escuela.  Si [place] transporte pblico, bjese una parada antes y camine el resto del camino.  Pngase de pie y camine cada vez que haga llamadas telefnicas.  Levntese, estrese y camine cada a lo largo del Futures trader. QU PAUTAS DEBO SEGUIR MIENTRAS HAGO EJERCICIO?  No haga ejercicio en exceso que pudiera hacer que se lastime, se sienta mareado o tenga dificultad para respirar.  Consulte al mdico antes de comenzar un programa de ejercicios nuevo.  Use ropa cmoda y calzado con buen  soporte.  Beba gran cantidad de agua mientras hace ejercicios para evitar la deshidratacin o los golpes de Airline pilotcalor. Durante la actividad fsica se pierde agua corporal que se debe reponer.  Haga ejercicio hasta que se acelere su respiracin y sus latidos cardacos.   Esta informacin no tiene Theme park managercomo fin reemplazar el consejo del mdico. Asegrese de hacerle al mdico cualquier pregunta que tenga.   Document Released: 04/11/2010 Document Revised: 01/26/2014 Elsevier Interactive Patient Education 2016 [employer]. Recuento de caloras para bajar de peso (Calorie Counting for Edison InternationalWeight Loss) Las caloras son energa que se obtiene de lo que se come y se bebe. El organismo [place]a esta energa para mantenerlo Scientist, water qualityactivo durante el da. La cantidad de caloras que come tiene incidencia Gap Incen el peso. Cuando come ms caloras de las que el cuerpo necesita, este acumula las caloras extra Hovencomo grasa. Cuando come Nationwide Mutual Insurancemenos caloras de las que el cuerpo [place]necesita, este quema grasa para obtener la energa que requiere. El recuento de caloras es el registro de la cantidad de caloras que come y Investment banker, operationalbebe cada da. Si se asegura de comer menos caloras de las que el cuerpo necesita, debe bajar de [place]peso. Para que el recuento de caloras funcione, tendr que comer la cantidad de caloras adecuadas para usted en un da, para bajar una cantidad de peso saludable por semana. Una cantidad de peso saludable para bajar por semana suele ser [place] 1 y Enedina Finner2libras (0,5 a 0,9kg). Un nutricionista puede determinar la cantidad de caloras que necesita por da y sugerirle cmo alcanzar su objetivo calrico.  CUL ES MI PLAN? Mi objetivo es comer __________ Garry Heatercaloras por da.  Si como esta cantidad de caloras por da, debo bajar unas __________ Albertine Grateslibras por semana. QU DEBO SABER ACERCA DEL RECUENTO DE CALORAS? A fin de alcanzar su objetivo diario de caloras, tendr que:  Averiguar cuntas caloras hay en cada alimento que le Lobbyistgustara comer.  Intente hacerlo antes de comer.  Decida la cantidad que puede comer del alimento.  Anote lo que comi y cuntas caloras tena. Esta tarea se conoce como llevar un registro de comidas. DNDE ENCUENTRO INFORMACIN SOBRE LAS CALORAS? Es posible Veterinary surgeonencontrar la cantidad de caloras que contiene un alimento en la etiqueta de informacin nutricional. Tenga en cuenta que toda la informacin que se incluye en una etiqueta se basa en una porcin especfica del alimento. Si un alimento no tiene una etiqueta de informacin nutricional, intente buscar las caloras en Internet o pida ayuda al nutricionista. CMO DECIDO CUNTO COMER? Para decidir qu cantidad del alimento puede comer, tendr que tener en cuenta el nmero de caloras en una porcin y el tamao de una porcin. Es posible Scientist, water qualityencontrar estos datos en la etiqueta de informacin nutricional. Si un alimento no tiene una etiqueta de informacin nutricional, busque los Troydatos en Internet o pida ayuda al nutricionista. Recuerde que las caloras se calculan por porcin. Si opta por comer ms de una porcin de un alimento, tendr D.R. Horton, Incque multiplicar las caloras por porcin por la cantidad de porciones que planea comer. Por ejemplo, la etiqueta de un envase de pan puede decir que el tamao de una porcin es Buck[place]1rodaja, y que una porcin tiene 90caloras. Si come 1rodaja, habr comido 90caloras. Si come 2rodajas, habr comido 180caloras. CMO LLEVO  UN REGISTRO DE COMIDAS? Despus de cada comida, registre la siguiente informacin en el registro de comidas:  Lo que comi.  La cantidad que comi.  La cantidad de caloras que tena.  Luego, sume las caloras. Tenga a Scientific laboratory technician de comidas, por ejemplo, en un anotador de bolsillo. Otra alternativa es usar una aplicacin en el telfono mvil o un sitio web. Algunos programas calcularn las caloras y [employer] Corporation la cantidad de caloras que le [place] vez que agregue un alimento al Engineer, maintenance (IT). CULES  SON ALGUNOS CONSEJOS PARA EL RECUENTO DE CALORAS?  Use las caloras de los alimentos y las bebidas que lo sacien y no lo dejen con apetito, por ejemplo, frutos secos y Civil engineer, contracting de frutos secos, verduras, protenas magras y alimentos con alto contenido de fibra (ms de 5g de fibra por porcin).  Coma alimentos nutritivos y evite las caloras vacas. Las caloras vacas son aquellas que se obtienen de los alimentos o las bebidas que no contienen muchos nutrientes, como los dulces y los refrescos. Es mejor comer una comida nutritiva altamente calrica (como un aguacate) que una con pocos nutrientes (como una bolsa de patatas fritas).  Sepa cuntas caloras tienen los alimentos que come con ms frecuencia. [employer] Global, no tiene que buscar este dato cada vez que los come.  Est atento a los International aid/development worker hipocalricos, pero que en realidad contienen muchas caloras, como los productos de [place], los refrescos y los dulces sin Holiday representative.  Preste atencin a las Limited Brands, Lubrizol Corporation refrescos, las bebidas a base de [place], las bebidas con alcohol y los jugos, que contienen muchas caloras, pero no le dan saciedad. Opte por las bebidas bajas en caloras, como el agua y las [address] dietticas.  Concntrese en tratar de contar las caloras de los alimentos que tienen la mayor cantidad de caloras. Registrar las caloras de una ensalada que solo contiene hortalizas es menos importante que calcular las de un batido de [place].  Encuentre un mtodo para controlar las caloras que funcione para usted. Sea creativo. La Harley-Davidson de las personas que alcanzan el xito encuentran mtodos para llevar un registro de cunto comen en un da, incluso si no cuentan cada calora. CULES SON ALGUNOS CONSEJOS PARA CONTROLAR LAS PORCIONES?  Sepa cuntas caloras hay en una porcin. Esto lo ayudar a saber cuntas porciones de un alimento determinado puede comer.  Use una taza medidora para medir  los Franklin Resources, lo que es muy til al principio. Con el tiempo, podr hacer un clculo estimativo de los tamaos de las porciones de algunos alimentos.  Dedique tiempo a poner porciones de diferentes alimentos en sus platos, tazones y tazas predilectos, a fin de saber cmo se ve una porcin.  Intente no comer directamente de [place] bolsa o una caja, ya que esto puede llevarlo a comer en exceso. Ponga la cantidad [employer] gustara comer en una taza o un plato, a fin de asegurarse de que est comiendo la porcin correcta.  Use platos, vasos y tazones ms pequeos para no comer en exceso. Esta es una forma rpida y sencilla de poner en prctica el control de las porciones. Si el plato es ms pequeo, le caben menos alimentos.  Intente no hacer muchas tareas mientras come, como ver televisin o usar la computadora. Si es la hora de comer, sintese a Museum/gallery conservator y disfrute de Chemical engineer. Esto lo ayudar a que empiece a Public house manager cundo est satisfecho. Tambin le  permitir estar ms consciente de lo que come y cunto est comiendo. CMO PUEDO HACER EL RECUENTO DE CALORAS CUANDO COMO AFUERA?  Pida porciones ms pequeas o porciones para nios.  Considere la posibilidad de Agricultural consultant un plato principal y las guarniciones, en lugar de pedir su propio plato principal.  Si pide su propio plato principal, coma solo la mitad. Pida una caja al comienzo de la comida y ponga all el resto del plato principal, para no sentir la tentacin de comerlo.  Busque las caloras en el men. Si se detallan las caloras, elija las opciones que contengan la menor cantidad.  Elija platos que incluyan verduras, frutas, cereales integrales, productos lcteos con bajo contenido de grasa y Associate Professor. Centrarse en elegir con inteligencia alimentos de cada uno de los 5grupos que puede ayudarlo a seguir por el buen camino en los restaurantes.  Opte por los alimentos hervidos, asados, cocidos a la parrilla o al  vapor.  Elija el agua, la [place], [place] t helado sin azcar u otras bebidas que no contengan azcares agregados. Si desea una bebida alcohlica, escoja una opcin con menos caloras. Por ejemplo, un margarita normal puede [employer], y un vaso de vino tiene unas 150.  No coma alimentos que contengan mantequilla, estn empanados, fritos o que se sirvan con salsa a base de crema. Generalmente, los alimentos que se etiquetan como "crujientes" estn fritos, a menos que se indique lo contrario.  Ordene los Pathmark Stores, las salsas y los jarabes aparte, ya que suelen tener muchas caloras; por lo tanto, no los consuma en grandes cantidades.  Tenga cuidado con las Franklin. Muchas personas piensan que las ensaladas son Neomia Dear opcin saludable, pero en muchas cosas, esto no es as. Hay muchas ensaladas que contienen tocino, pollo frito, grandes cantidades de [place], patatas fritas y Print production planner. Todos estos productos son altamente calricos. Si desea [place], elija una de hortalizas y pida carnes a la parrilla o un filete. Ordene el aderezo aparte o pida aceite de [place] y vinagre o limn para Emergency planning/management officer.  Haga un clculo estimativo de la cantidad de porciones que le sirven. Por ejemplo, una porcin de arroz cocido equivale a media taza o tiene el tamao aproximado de un molde de [place], o de media pelota de tenis. Conocer el tamao de las porciones lo ayudar a Theme park manager atento a la cantidad de comida que come Pitney Bowes. La lista que sigue le [place] el tamao de algunas porciones comunes a partir de objetos cotidianos.  1onza (28g) = 4dados apilados.  3onzas (85g) = de cartas.  1cucharadita = 1dado.  1cucharada = media pelota de tenis de mesa.  2cucharadas = 1pelota de tenis de mesa.  Media taza = 1pelota de tenis o de magdalena.  1taza = 1 pelota de bisbol.   Esta informacin no tiene Theme park manager el consejo del mdico. Asegrese de hacerle al mdico  cualquier pregunta que tenga.   Document Released: 04/23/2008 Document Revised: 01/26/2014 Elsevier Interactive Patient Education [employer]

## 2015-10-03 ENCOUNTER — Ambulatory Visit (INDEPENDENT_AMBULATORY_CARE_PROVIDER_SITE_OTHER): Payer: Self-pay | Admitting: Family Medicine

## 2015-10-03 VITALS — BP 114/78 | HR 73 | Temp 97.9°F | Resp 17 | Ht 68.5 in | Wt 212.0 lb

## 2015-10-03 DIAGNOSIS — K6289 Other specified diseases of anus and rectum: Secondary | ICD-10-CM

## 2015-10-03 DIAGNOSIS — K602 Anal fissure, unspecified: Secondary | ICD-10-CM

## 2015-10-03 MED ORDER — HYDROCORTISONE 2.5 % RE CREA: 1.0000 "application " | TOPICAL_CREAM | Freq: Two times a day (BID) | RECTAL | 0 refills | Status: DC

## 2015-10-03 MED ORDER — LIDOCAINE HCL 2 % EX GEL: 1.0000 "application " | Freq: Three times a day (TID) | CUTANEOUS | 0 refills | Status: DC | PRN

## 2015-10-03 NOTE — Progress Notes (Signed)
Subjective:  By signing my name below, I, [name], attest that this documentation has been prepared under the direction and in the presence of [name], MD. Electronically Signed: [name], Scribe. 10/03/2015 , 12:30 PM .  Patient was seen in Room 2 .   Patient ID: Nathan Hickman, male    DOB: 10/24/1970, 45 y.o.   MRN: [id number] Chief Complaint  Patient presents with  . Hemorrhoids    Blood in stool   . Rectal Pain   HPI Nathan Hickman is a 45 y.o. male  Here for hemorrhoids, blood in stool and rectal pain. He was seen for rectal bleeding earlier this year. No fissure or hemorrhoids were noted at that time but suspected hemorrhoids. He was seen 1 day later for repeated CBC.   He noticed a lot of rectal pain starting 3 days ago. He tried taking advil and applying preparation H cream to the area. He denies fever, constipation or blood in stool.   There are no active problems to display for this patient.  No past medical history on file. No past surgical history on file. No Known Allergies Prior to Admission medications   Medication Sig Start Date End Date Taking? Authorizing Provider  ibuprofen (ADVIL,MOTRIN) 200 MG tablet Take 200 mg by mouth every 6 (six) hours as needed.   Yes Historical Provider, MD  phenylephrine-shark liver oil-mineral oil-petrolatum (PREPARATION H) 0.25-3-14-71.9 % rectal ointment Place 1 application rectally 2 (two) times daily as needed for hemorrhoids.   Yes Historical Provider, MD   Social History   Social History  . Marital status: Married    Spouse name: N/A  . Number of children: N/A  . Years of education: N/A   Occupational History  . Not on file.   Social History Main Topics  . Smoking status: Never Smoker  . Smokeless tobacco: Not on file  . Alcohol use No  . Drug use: Unknown  . Sexual activity: Not on file   Other Topics Concern  . Not on file   Social History Narrative  . No narrative on file   Review of  Systems  Constitutional: Negative for chills, diaphoresis and fever.  Gastrointestinal: Positive for rectal pain. Negative for anal bleeding, blood in stool and constipation.       Objective:   Physical Exam  Constitutional: He is oriented to person, place, and time. He appears well-developed and well-nourished. No distress.  HENT:  Head: Normocephalic and atraumatic.  Eyes: EOM are normal. Pupils are equal, round, and reactive to light.  Neck: Neck supple.  Cardiovascular: Normal rate.   Pulmonary/Chest: Effort normal. No respiratory distress.  Genitourinary: Rectal exam shows no external hemorrhoid.  Genitourinary Comments: Firm area approximately 1cm at 6 o'clock of the anus, possible fissure versus raw with slight firmness and tenderness, no active bleeding; difficult rectal exam due to discomfort at 6 o'clock  Musculoskeletal: Normal range of motion.  Neurological: He is alert and oriented to person, place, and time.  Skin: Skin is warm and dry.  Psychiatric: He has a normal mood and affect. His behavior is normal.  Nursing note and vitals reviewed.  Vitals:   10/03/15 1123  BP: 114/78  Pulse: 73  Resp: 17  Temp: 97.9 F (36.6 C)  TempSrc: Oral  SpO2: 99%  Weight: 212 lb (96.2 kg)  Height: 5' 8.5" (1.74 m)       Assessment & Plan:    Nathan Hickman is a 45 y.o. male  Rectal pain - Plan: hydrocortisone (ANUSOL-HC) 2.5 % rectal cream, lidocaine (XYLOCAINE) 2 % jelly  Anal fissure - Plan: hydrocortisone (ANUSOL-HC) 2.5 % rectal cream, lidocaine (XYLOCAINE) 2 % jelly  Suspected anal fissure by hx vs small hemorhoid with swelling at 6 oclock, vs less likely early perianal fistula.  - xylocaine jelly, anusol hc and recheck if not improving in next few days - sooner if worse.   - spanish and english spoken with understanding expressed.   Meds ordered this encounter  Medications  . ibuprofen (ADVIL,MOTRIN) 200 MG tablet    Sig: Take 200 mg by mouth every 6 (six) hours  as needed.  . phenylephrine-shark liver oil-mineral oil-petrolatum (PREPARATION H) 0.25-3-14-71.9 % rectal ointment    Sig: Place 1 application rectally 2 (two) times daily as needed for hemorrhoids.  . hydrocortisone (ANUSOL-HC) 2.5 % rectal cream    Sig: Place 1 application rectally 2 (two) times daily.    Dispense:  30 g    Refill:  0  . lidocaine (XYLOCAINE) 2 % jelly    Sig: Apply 1 application topically 3 (three) times daily as needed. To rectal area as needed. Label in spanish.    Dispense:  30 mL    Refill:  0   Patient Instructions   Su dolor es posible hemorroides o fisura anal. anusol hc 2 veces cada dia y lidocaine 3 veces cada dia si necesario.  Un otro posibilidad es una infeccion cerca de ano. por esta razon es necesario regrese en 2 dias por un otro examen - mas temprano si empeorse.    Hemorroides  (Hemorrhoids) Las hemorroides son venas inflamadas alrededor del recto o del ano. Hay dos tipos de hemorroides:   Hemorroides internas. Se forman en las venas del interior del recto. Pueden abultarse hacia el exterior e irritarse y Cabin crew.  Hemorroides externas. Se producen en las venas externas al ano y pueden sentirse como un bulto o zona hinchada dura y dolorosa cerca del ano. CAUSAS   Embarazo.   Obesidad.   Constipacin o diarrea.   Dificultad para mover el intestino.   Permanecer sentado durante largos perodos en el inodoro.  Levantar objetos pesados u otras actividades que impliquen esfuerzo.  Sexo anal. SNTOMAS   Dolor.   Picazn o irritacin anal.   Sangrado rectal.   Prdida fecal.   Inflamacin anal.   Uno o ms bultos en la zona anal.  DIAGNSTICO  El mdico puede diagnosticar las hemorroides mediante un examen visual. Otros estudios o anlisis que se pueden realizar son:   Examen de la zona rectal con Neomia Dear mano enguantada (examen digital rectal).   Examen del canal anal utilizando un pequeo tubo (endoscopio).   Anlisis  de sangre si ha perdido [place] cantidad significativa de [place].  Un estudio para observar el interior del colon (sigmoideoscopa o colonoscopa). TRATAMIENTO  La mayora de las hemorroides pueden tratarse en casa. Sin embargo, si los sntomas no mejoran o tiene Runner, broadcasting/film/video, el mdico puede Education officer, environmental un procedimiento para disminuir las hemorroides o extirparlas completamente. Los tratamientos posibles son:   Colocacin de una banda de goma en la base de la hemorroide para cortar la circulacin (ligadura con Curator).   Inyeccin de una sustancia qumica para Programmer, systems las hemorroides (escleroterapia).   Utilizacin de un instrumento para quemar las hemorroides (terapia con luz infrarroja).   Extirpacin quirrgica de la hemorroides (hemorroidectoma).   Colocacin de grapas en la hemorroides para bloquear el flujo de sangre a los tejidos (engrapado  de hemorroides).  INSTRUCCIONES PARA EL CUIDADO EN EL HOGAR   Consuma alimentos con fibra, como cereales integrales, legumbres, frutos secos, frutas y verduras. Pregntele a su mdico acerca de tomar productos con fibra aadida en ellos (suplementos defibra).  Aumente la ingestin de lquidos. Beba gran cantidad de lquido para mantener la orina de tono claro o color amarillo plido.   Haga ejercicios regularmente.   Vaya al bao cuando sienta la necesidad de mover el intestino. No espere.   Evite hacer fuerza al mover el intestino.   Mantenga la zona anal limpia y seca. Use papel higinico hmedo o toallitas humedecidas despus de mover el intestino.   Puede usar o Contractor segn las indicaciones algunas cremas especiales y supositorios.   Tome slo medicamentos de venta libre o recetados, segn las indicaciones del mdico.   Tome baos de asiento tibios durante 15-20 minutos, 3-4 veces por da para Primary school teacher y las [place].   Coloque una bolsa de hielo sobre las hemorroides si le duelen o se hinchan. Usar  compresas de [employer] baos de asiento puede ser [place].   Ponga el hielo en una bolsa plstica.   Colquese una toalla entre la piel y la bolsa de hielo.   Deje el hielo durante 15 - 20 minutos y aplquelo 3 - 4 veces por Futures trader.   No utilice una almohada en forma de aro ni se siente en el inodoro durante perodos prolongados. Esto aumenta la afluencia de sangre y Chief Technology Officer.  SOLICITE ATENCIN MDICA SI:   Aumenta el dolor y la hinchazn y no puede controlarlo con la medicacin o con Pharmacist, community.  Tiene un sangrado que no Magazine features editor.  No puede mover el intestino.  Siente dolor o tiene inflamacin fuera de la zona de las hemorroides. ASEGRESE DE QUE:   Comprende estas instrucciones.  Controlar su enfermedad.  Recibir ayuda de inmediato si no mejora o si empeora.   Esta informacin no tiene Theme park manager el consejo del mdico. Asegrese de hacerle al mdico cualquier pregunta que tenga.   Document Released: 01/05/2005 Document Revised: 09/07/2012 Elsevier Interactive Patient Education 2016 [employer].     Fisura anal en los adultos (Anal Fissure, Adult) Clifton James anal es un pequeo desgarro o un corte en la piel que rodea el ano. El sangrado proveniente de una fisura suele detenerse solo despus de algunos minutos. Pero generalmente, se repetir cada vez que defeque hasta que el corte cicatrice. CAUSAS Esta afeccin puede ser causada por lo siguiente:  Defecacin de material fecal (heces) dura y voluminosa.  Diarrea frecuente.  Estreimiento.  Enfermedad intestinal inflamatoria (enfermedad de Crohn o colitis ulcerosa).  Infecciones.  Sexo anal. SNTOMAS Los sntomas de esta afeccin incluyen lo siguiente:  Sangrado que proviene del recto.  Pequeas cantidades de sangre que se observan en las heces, en el papel higinico o en el inodoro despus de defecar.  Dolor al defecar.  Picazn o irritacin alrededor del  ano. DIAGNSTICO El mdico puede diagnosticar esta afeccin con un examen exhaustivo de la zona anal. Tyson Dense revisin cuidadosa permite observar la fisura anal. En algunos casos, se puede Museum/gallery conservator, o bien utilizar un tubo corto (anoscopio) para examinar el conducto anal. TRATAMIENTO El tratamiento de esta afeccin puede incluir lo siguiente:  Tomar medidas para evitar el estreimiento, que pueden incluir cambios en la dieta, por ejemplo, aumentar la ingesta de fibras o de lquidos.  Tomar suplementos de [place], los cuales pueden ablandar  las heces y ayudar a Museum/gallery conservator. El mdico tambin puede recetarle un ablandador de heces si estas suelen ser duras.  Tomar baos de asiento, que pueden ayudar a que el Merchandiser, retail.  Usar cremas o ungentos medicinales, que se pueden recetar para [employer]. INSTRUCCIONES PARA EL CUIDADO EN EL HOGAR Comida y bebida  No consuma los alimentos que pueden causar estreimiento, como las bananas y los productos lcteos.  Beba suficiente lquido para Photographer orina clara o de color amarillo plido.  Siga una dieta que incluya gran cantidad de frutas, cereales integrales y verduras. Instrucciones generales  Mantenga la zona anal tan limpia y seca como sea posible.  Tome baos de asiento como se lo haya indicado el mdico. No use jabn en los baos de [place].  Tome los medicamentos de venta libre y los recetados solamente como se lo haya indicado el mdico.  Aplquese cremas o ungentos como se lo haya indicado el mdico.  Concurra a todas las visitas de control como se lo haya indicado el mdico. Esto es importante. SOLICITE ATENCIN MDICA SI:  Aumenta el sangrado.  Tiene fiebre.  Tiene diarrea mezclada con [place].  Sigue teniendo [employer]  El problema empeora en vez de mejorar.   Esta informacin no tiene Theme park manager el consejo del mdico. Asegrese de hacerle al mdico  cualquier pregunta que tenga.   Document Released: 01/05/2005 Document Revised: 09/26/2014 Elsevier Interactive Patient Education [employer]    IF you received an x-ray today, you will receive an invoice from [facility]. Please contact [facility] at [phone] with questions or concerns regarding your invoice.   IF you received labwork today, you will receive an invoice from [employer]. Please contact [facility] at [phone] with questions or concerns regarding your invoice.   Our billing staff will not be able to assist you with questions regarding bills from these companies.  You will be contacted with the lab results as soon as they are available. The fastest way to get your results is to activate your My Chart account. Instructions are located on the last page of this paperwork. If you have not heard from [place] regarding the results in 2 weeks, please contact this office.        I personally performed the services described in this documentation, which was scribed in my presence. The recorded information has been reviewed and considered, and addended by me as needed.   Signed,   Meredith Staggers, MD [facility].  10/05/15 12:15 PM

## 2015-10-03 NOTE — Patient Instructions (Addendum)
Su dolor es posible hemorroides o fisura anal. anusol hc 2 veces cada dia y lidocaine 3 veces cada dia si necesario.  Un otro posibilidad es una infeccion cerca de ano. por esta razon es necesario regrese en 2 dias por un otro examen - mas temprano si empeorse.    Hemorroides  (Hemorrhoids) Las hemorroides son venas inflamadas alrededor del recto o del ano. Hay dos tipos de hemorroides:   Hemorroides internas. Se forman en las venas del interior del recto. Pueden abultarse hacia el exterior e irritarse y Cabin crew.  Hemorroides externas. Se producen en las venas externas al ano y pueden sentirse como un bulto o zona hinchada dura y dolorosa cerca del ano. CAUSAS   Embarazo.   Obesidad.   Constipacin o diarrea.   Dificultad para mover el intestino.   Permanecer sentado durante largos perodos en el inodoro.  Levantar objetos pesados u otras actividades que impliquen esfuerzo.  Sexo anal. SNTOMAS   Dolor.   Picazn o irritacin anal.   Sangrado rectal.   Prdida fecal.   Inflamacin anal.   Uno o ms bultos en la zona anal.  DIAGNSTICO  El mdico puede diagnosticar las hemorroides mediante un examen visual. Otros estudios o anlisis que se pueden realizar son:   Examen de la zona rectal con Neomia Dear mano enguantada (examen digital rectal).   Examen del canal anal utilizando un pequeo tubo (endoscopio).   Anlisis de sangre si ha perdido [place] cantidad significativa de [place].  Un estudio para observar el interior del colon (sigmoideoscopa o colonoscopa). TRATAMIENTO  La mayora de las hemorroides pueden tratarse en casa. Sin embargo, si los sntomas no mejoran o tiene Runner, broadcasting/film/video, el mdico puede Education officer, environmental un procedimiento para disminuir las hemorroides o extirparlas completamente. Los tratamientos posibles son:   Colocacin de una banda de goma en la base de la hemorroide para cortar la circulacin (ligadura con Curator).   Inyeccin de una  sustancia qumica para Programmer, systems las hemorroides (escleroterapia).   Utilizacin de un instrumento para quemar las hemorroides (terapia con luz infrarroja).   Extirpacin quirrgica de la hemorroides (hemorroidectoma).   Colocacin de grapas en la hemorroides para bloquear el flujo de sangre a los tejidos (engrapado de hemorroides).  INSTRUCCIONES PARA EL CUIDADO EN EL HOGAR   Consuma alimentos con fibra, como cereales integrales, legumbres, frutos secos, frutas y verduras. Pregntele a su mdico acerca de tomar productos con fibra aadida en ellos (suplementos defibra).  Aumente la ingestin de lquidos. Beba gran cantidad de lquido para mantener la orina de tono claro o color amarillo plido.   Haga ejercicios regularmente.   Vaya al bao cuando sienta la necesidad de mover el intestino. No espere.   Evite hacer fuerza al mover el intestino.   Mantenga la zona anal limpia y seca. Use papel higinico hmedo o toallitas humedecidas despus de mover el intestino.   Puede usar o Contractor segn las indicaciones algunas cremas especiales y supositorios.   Tome slo medicamentos de venta libre o recetados, segn las indicaciones del mdico.   Tome baos de asiento tibios durante 15-20 minutos, 3-4 veces por da para Primary school teacher y las [place].   Coloque una bolsa de hielo sobre las hemorroides si le duelen o se hinchan. Usar compresas de [employer] baos de asiento puede ser [place].   Ponga el hielo en una bolsa plstica.   Colquese una toalla entre la piel y la bolsa de hielo.   Deje el hielo durante 15 -  20 minutos y aplquelo 3 - 4 veces por Futures trader.   No utilice una almohada en forma de aro ni se siente en el inodoro durante perodos prolongados. Esto aumenta la afluencia de sangre y Chief Technology Officer.  SOLICITE ATENCIN MDICA SI:   Aumenta el dolor y la hinchazn y no puede controlarlo con la medicacin o con Pharmacist, community.  Tiene un sangrado que no Electronics engineer.  No puede mover el intestino.  Siente dolor o tiene inflamacin fuera de la zona de las hemorroides. ASEGRESE DE QUE:   Comprende estas instrucciones.  Controlar su enfermedad.  Recibir ayuda de inmediato si no mejora o si empeora.   Esta informacin no tiene Theme park manager el consejo del mdico. Asegrese de hacerle al mdico cualquier pregunta que tenga.   Document Released: 01/05/2005 Document Revised: 09/07/2012 Elsevier Interactive Patient Education 2016 [employer].     Fisura anal en los adultos (Anal Fissure, Adult) Clifton James anal es un pequeo desgarro o un corte en la piel que rodea el ano. El sangrado proveniente de una fisura suele detenerse solo despus de algunos minutos. Pero generalmente, se repetir cada vez que defeque hasta que el corte cicatrice. CAUSAS Esta afeccin puede ser causada por lo siguiente:  Defecacin de material fecal (heces) dura y voluminosa.  Diarrea frecuente.  Estreimiento.  Enfermedad intestinal inflamatoria (enfermedad de Crohn o colitis ulcerosa).  Infecciones.  Sexo anal. SNTOMAS Los sntomas de esta afeccin incluyen lo siguiente:  Sangrado que proviene del recto.  Pequeas cantidades de sangre que se observan en las heces, en el papel higinico o en el inodoro despus de defecar.  Dolor al defecar.  Picazn o irritacin alrededor del ano. DIAGNSTICO El mdico puede diagnosticar esta afeccin con un examen exhaustivo de la zona anal. Tyson Dense revisin cuidadosa permite observar la fisura anal. En algunos casos, se puede Museum/gallery conservator, o bien utilizar un tubo corto (anoscopio) para examinar el conducto anal. TRATAMIENTO El tratamiento de esta afeccin puede incluir lo siguiente:  Tomar medidas para evitar el estreimiento, que pueden incluir cambios en la dieta, por ejemplo, aumentar la ingesta de fibras o de lquidos.  Tomar suplementos de [place], los cuales pueden ablandar  las heces y ayudar a Museum/gallery conservator. El mdico tambin puede recetarle un ablandador de heces si estas suelen ser duras.  Tomar baos de asiento, que pueden ayudar a que el Merchandiser, retail.  Usar cremas o ungentos medicinales, que se pueden recetar para [employer]. INSTRUCCIONES PARA EL CUIDADO EN EL HOGAR Comida y bebida  No consuma los alimentos que pueden causar estreimiento, como las bananas y los productos lcteos.  Beba suficiente lquido para Photographer orina clara o de color amarillo plido.  Siga una dieta que incluya gran cantidad de frutas, cereales integrales y verduras. Instrucciones generales  Mantenga la zona anal tan limpia y seca como sea posible.  Tome baos de asiento como se lo haya indicado el mdico. No use jabn en los baos de [place].  Tome los medicamentos de venta libre y los recetados solamente como se lo haya indicado el mdico.  Aplquese cremas o ungentos como se lo haya indicado el mdico.  Concurra a todas las visitas de control como se lo haya indicado el mdico. Esto es importante. SOLICITE ATENCIN MDICA SI:  Aumenta el sangrado.  Tiene fiebre.  Tiene diarrea mezclada con [place].  Sigue teniendo [employer]  El problema empeora en vez de mejorar.   Esta informacin no tiene  como fin reemplazar el consejo del mdico. Asegrese de hacerle al mdico cualquier pregunta que tenga.   Document Released: 01/05/2005 Document Revised: 09/26/2014 Elsevier Interactive Patient Education [employer] Elsevier Inc.    IF you received an x-ray today, you will receive an [facility] from [facility]. Please contact [facility] at [phone] with questions or concerns regarding your invoice.   IF you received labwork today, you will receive an invoice from [facility]. Please contact [facility] at [phone] with questions or concerns regarding your invoice.   Our billing staff will not be able  to assist you with questions regarding bills from these companies.  You will be contacted with the lab results as soon as they are available. The fastest way to get your results is to activate your My Chart account. Instructions are located on the last page of this paperwork. If you have not heard from us regarding the results in 2 weeks, please contact this office.

## 2015-10-04 ENCOUNTER — Telehealth: Payer: Self-pay | Admitting: *Deleted

## 2015-10-04 ENCOUNTER — Ambulatory Visit (INDEPENDENT_AMBULATORY_CARE_PROVIDER_SITE_OTHER): Payer: Self-pay | Admitting: Physician Assistant

## 2015-10-04 VITALS — BP 120/82 | HR 99 | Temp 98.0°F | Resp 18 | Ht 68.5 in | Wt 212.6 lb

## 2015-10-04 DIAGNOSIS — K602 Anal fissure, unspecified: Secondary | ICD-10-CM

## 2015-10-04 MED ORDER — DILTIAZEM GEL 2 %: CUTANEOUS | 0 refills | Status: DC

## 2015-10-04 NOTE — Patient Instructions (Addendum)
Increase fiber intake to 20-35 grams/day, stay WELL HYDRATED with water. Take sitz baths.  (see instructions below) Anal fissures take a few weeks to heal. Follow-up in one month if your symptoms have not resolved.     IF you received an x-ray today, you will receive an invoice from [facility]. Please contact [facility] at [phone] with questions or concerns regarding your invoice.   IF you received labwork today, you will receive an invoice from [employer]. Please contact [facility] at [phone] with questions or concerns regarding your invoice.   Our billing staff will not be able to assist you with questions regarding bills from these companies.  You will be contacted with the lab results as soon as they are available. The fastest way to get your results is to activate your My Chart account. Instructions are located on the last page of this paperwork. If you have not heard from [place] regarding the results in 2 weeks, please contact this office.      Fisura anal en los adultos (Anal Fissure, Adult) Una fisura anal es un pequeo desgarro o un corte en la piel que rodea el ano. El sangrado proveniente de una fisura suele detenerse solo despus de algunos minutos. Pero generalmente, se repetir cada vez que defeque hasta que el corte cicatrice. CAUSAS Esta afeccin puede ser causada por lo siguiente:  Defecacin de material fecal (heces) dura y voluminosa.  Diarrea frecuente.  Estreimiento.  Enfermedad intestinal inflamatoria (enfermedad de Crohn o colitis ulcerosa).  Infecciones.  Sexo anal. SNTOMAS Los sntomas de esta afeccin incluyen lo siguiente:  Sangrado que proviene del recto.  Pequeas cantidades de sangre que se observan en las heces, en el papel higinico o en el inodoro despus de defecar.  Dolor al defecar.  Picazn o irritacin alrededor del ano. DIAGNSTICO El mdico puede diagnosticar esta afeccin con un  examen exhaustivo de la zona anal. Tyson Dense revisin cuidadosa permite observar la fisura anal. En algunos casos, se puede Museum/gallery conservator, o bien utilizar un tubo corto (anoscopio) para examinar el conducto anal. TRATAMIENTO El tratamiento de esta afeccin puede incluir lo siguiente:  Tomar medidas para evitar el estreimiento, que pueden incluir cambios en la dieta, por ejemplo, aumentar la ingesta de fibras o de lquidos.  Tomar suplementos de [place], los cuales pueden ablandar las heces y ayudar a Museum/gallery conservator. El mdico tambin puede recetarle un ablandador de heces si estas suelen ser duras.  Tomar baos de asiento, que pueden ayudar a que el Merchandiser, retail.  Usar cremas o ungentos medicinales, que se pueden recetar para [employer]. INSTRUCCIONES PARA EL CUIDADO EN EL HOGAR Comida y bebida  No consuma los alimentos que pueden causar estreimiento, como las bananas y los productos lcteos.  Beba suficiente lquido para Photographer orina clara o de color amarillo plido.  Siga una dieta que incluya gran cantidad de frutas, cereales integrales y verduras. Instrucciones generales  Mantenga la zona anal tan limpia y seca como sea posible.  Tome baos de asiento como se lo haya indicado el mdico. No use jabn en los baos de [place].  Tome los medicamentos de venta libre y los recetados solamente como se lo haya indicado el mdico.  Aplquese cremas o ungentos como se lo haya indicado el mdico.  Concurra a todas las visitas de control como se lo haya indicado el mdico. Esto es importante. SOLICITE ATENCIN MDICA SI:  Aumenta el sangrado.  Tiene fiebre.  Tiene  diarrea mezclada con sangre.  Sigue teniendo [employer].  El problema empeora en vez de mejorar.   Esta informacin no tiene Theme park managercomo fin reemplazar el consejo del mdico. Asegrese de hacerle al mdico cualquier pregunta que tenga.   Document Released: 01/05/2005 Document  Revised: 09/26/2014 Elsevier Interactive Patient Education [employer]2016 Elsevier Inc.

## 2015-10-04 NOTE — Progress Notes (Signed)
   Nathan BolognaJose E Hickman  MRN: [id number] DOB: 11/01/1970  PCP: No PCP Per Patient  Subjective:  Pt presents to clinic for pain near his rectum. He was here yesterday for the same, saw Dr. Neva SeatGreene, instructed to use anusol hc 2 times each day and lidocaine 3 times each day as needed for hemorrhoids.  Did not sleep well last night due to pain.  States his sister "felt two bubbles" in his rectum a few days ago.  Drinks 8 oz water a day.  Hard, painful stools. He uses the restroom by standing on the toilet seat and squatting down.  Denies fever, nausea, vomiting, chills.   Review of Systems  Constitutional: Negative.   Cardiovascular: Negative.   Gastrointestinal: Positive for rectal pain. Negative for anal bleeding, blood in stool, constipation and diarrhea.  Skin: Negative.     There are no active problems to display for this patient.   Current Outpatient Prescriptions on File Prior to Visit  Medication Sig Dispense Refill  . hydrocortisone (ANUSOL-HC) 2.5 % rectal cream Place 1 application rectally 2 (two) times daily. 30 g 0  . ibuprofen (ADVIL,MOTRIN) 200 MG tablet Take 200 mg by mouth every 6 (six) hours as needed.    . lidocaine (XYLOCAINE) 2 % jelly Apply 1 application topically 3 (three) times daily as needed. To rectal area as needed. Label in spanish. 30 mL 0  . phenylephrine-shark liver oil-mineral oil-petrolatum (PREPARATION H) 0.25-3-14-71.9 % rectal ointment Place 1 application rectally 2 (two) times daily as needed for hemorrhoids.     No current facility-administered medications on file prior to visit.     No Known Allergies  Objective:  BP 120/82   Pulse 99   Temp 98 F (36.7 C) (Oral)   Resp 18   Ht 5' 8.5" (1.74 m)   Wt 212 lb 9.6 oz (96.4 kg)   SpO2 99%   BMI 31.86 kg/m   Physical Exam  Constitutional: He is oriented to person, place, and time and well-developed, well-nourished, and in no distress. No distress.  Cardiovascular: Normal rate, regular rhythm and  normal heart sounds.   Pulmonary/Chest: Effort normal. No respiratory distress.  Genitourinary: Rectal exam shows fissure (2.5 cm fissure 6 o'clock position of rectum ). Rectal exam shows no external hemorrhoid and no internal hemorrhoid.  Neurological: He is alert and oriented to person, place, and time. GCS score is 15.  Skin: Skin is warm and dry.  Psychiatric: Mood, memory, affect and judgment normal.  Vitals reviewed.   Assessment and Plan :  1. Anal fissure - diltiazem 2 % GEL; Apply a pea-sized amount TID  Dispense: 30 g; Refill: 0 - Discussed with patient importance of softening stools to decrease straining with bowel movements: stay well hydrated with at least 2 L water/day, increase fiber intake 20-35 grams/day. Patient understands that anal fissures may take a month to heal. Printed out and discussed with patient supportive care including sitz baths. He will apply topical lidocaine and topical cardizem as directed. If there is no improvement in a month, will refer to GI for repair.     Marco CollieWhitney Alandria Butkiewicz, PA-C  [facility] 10/04/2015 11:37 AM

## 2015-10-04 NOTE — Telephone Encounter (Signed)
Rx was left.  [facility]  Tried to patient to let know but phone was busy x3.

## 2015-10-05 ENCOUNTER — Encounter (HOSPITAL_COMMUNITY)
Admission: EM | Disposition: A | Discharge status: Home / Self Care | Payer: Self-pay | Source: Home / Self Care | Attending: Emergency Medicine

## 2015-10-05 ENCOUNTER — Emergency Department (HOSPITAL_COMMUNITY): Payer: Self-pay | Admitting: Anesthesiology

## 2015-10-05 ENCOUNTER — Encounter (HOSPITAL_COMMUNITY): Payer: Self-pay | Admitting: *Deleted

## 2015-10-05 ENCOUNTER — Emergency Department (HOSPITAL_COMMUNITY): Payer: Self-pay

## 2015-10-05 ENCOUNTER — Observation Stay (HOSPITAL_COMMUNITY)
Admission: EM | Admit: 2015-10-05 | Discharge: 2015-10-06 | Disposition: A | Discharge status: Home / Self Care | Payer: Self-pay | Attending: Surgery | Admitting: Surgery

## 2015-10-05 DIAGNOSIS — K611 Rectal abscess: Secondary | ICD-10-CM | POA: Diagnosis present

## 2015-10-05 DIAGNOSIS — K610 Anal abscess: Secondary | ICD-10-CM

## 2015-10-05 DIAGNOSIS — K612 Anorectal abscess: Principal | ICD-10-CM | POA: Insufficient documentation

## 2015-10-05 DIAGNOSIS — K649 Unspecified hemorrhoids: Secondary | ICD-10-CM | POA: Insufficient documentation

## 2015-10-05 HISTORY — PX: INCISION AND DRAINAGE PERIRECTAL ABSCESS: SHX1804

## 2015-10-05 LAB — CBC WITH DIFFERENTIAL/PLATELET
BASOS PCT: 0 %
Basophils Absolute: 0 10*3/uL (ref 0.0–0.1)
EOS ABS: 0 10*3/uL (ref 0.0–0.7)
Eosinophils Relative: 0 %
HEMATOCRIT: 43.7 % (ref 39.0–52.0)
HEMOGLOBIN: 15.3 g/dL (ref 13.0–17.0)
LYMPHS ABS: 1.5 10*3/uL (ref 0.7–4.0)
Lymphocytes Relative: 14 %
MCH: 31.2 pg (ref 26.0–34.0)
MCHC: 35 g/dL (ref 30.0–36.0)
MCV: 89.2 fL (ref 78.0–100.0)
MONOS PCT: 9 %
Monocytes Absolute: 0.9 10*3/uL (ref 0.1–1.0)
NEUTROS ABS: 8.4 10*3/uL — AB (ref 1.7–7.7)
NEUTROS PCT: 77 %
Platelets: 200 10*3/uL (ref 150–400)
RBC: 4.9 MIL/uL (ref 4.22–5.81)
RDW: 12.3 % (ref 11.5–15.5)
WBC: 10.9 10*3/uL — AB (ref 4.0–10.5)

## 2015-10-05 LAB — COMPREHENSIVE METABOLIC PANEL
ALBUMIN: 3.7 g/dL (ref 3.5–5.0)
ALK PHOS: 77 U/L (ref 38–126)
ALT: 19 U/L (ref 17–63)
ANION GAP: 7 (ref 5–15)
AST: 16 U/L (ref 15–41)
BILIRUBIN TOTAL: 0.7 mg/dL (ref 0.3–1.2)
BUN: 10 mg/dL (ref 6–20)
CALCIUM: 8.9 mg/dL (ref 8.9–10.3)
CO2: 26 mmol/L (ref 22–32)
CREATININE: 0.79 mg/dL (ref 0.61–1.24)
Chloride: 105 mmol/L (ref 101–111)
GFR calc Af Amer: >60 mL/min (ref >60)
GFR calc non Af Amer: >60 mL/min (ref >60)
GLUCOSE: 109 mg/dL — AB (ref 65–99)
Potassium: 4.1 mmol/L (ref 3.5–5.1)
SODIUM: 138 mmol/L (ref 135–145)
TOTAL PROTEIN: 7.1 g/dL (ref 6.5–8.1)

## 2015-10-05 LAB — I-STAT CG4 LACTIC ACID, ED
Lactic Acid, Venous: 1.15 mmol/L (ref 0.5–1.9)
Lactic Acid, Venous: 0.95 mmol/L (ref 0.5–1.9)

## 2015-10-05 SURGERY — INCISION AND DRAINAGE, ABSCESS, PERIRECTAL
Anesthesia: General | Site: Anus

## 2015-10-05 MED ORDER — BUPIVACAINE HCL (PF) 0.25 % IJ SOLN
INTRAMUSCULAR | Status: DC | PRN
  Administered 2015-10-05: 10 mL

## 2015-10-05 MED ORDER — OXYCODONE HCL 5 MG PO TABS: 5.0000 mg | ORAL_TABLET | ORAL | Status: DC | PRN

## 2015-10-05 MED ORDER — SUCCINYLCHOLINE CHLORIDE 200 MG/10ML IV SOSY
PREFILLED_SYRINGE | INTRAVENOUS | Status: AC
  Filled 2015-10-05: qty 10

## 2015-10-05 MED ORDER — ONDANSETRON HCL 4 MG/2ML IJ SOLN: 4.0000 mg | Freq: Four times a day (QID) | INTRAMUSCULAR | Status: DC | PRN

## 2015-10-05 MED ORDER — HYDROMORPHONE HCL 1 MG/ML IJ SOLN
1.0000 mg | Freq: Once | INTRAMUSCULAR | Status: AC
  Administered 2015-10-05: 1 mg via INTRAVENOUS
  Filled 2015-10-05: qty 1

## 2015-10-05 MED ORDER — SUCCINYLCHOLINE CHLORIDE 200 MG/10ML IV SOSY
PREFILLED_SYRINGE | INTRAVENOUS | Status: DC | PRN
  Administered 2015-10-05: 100 mg via INTRAVENOUS

## 2015-10-05 MED ORDER — DOCUSATE SODIUM 100 MG PO CAPS
100.0000 mg | ORAL_CAPSULE | Freq: Two times a day (BID) | ORAL | Status: DC
  Administered 2015-10-05 – 2015-10-06 (×2): 100 mg via ORAL
  Filled 2015-10-05 (×2): qty 1

## 2015-10-05 MED ORDER — ONDANSETRON HCL 4 MG/2ML IJ SOLN
INTRAMUSCULAR | Status: AC
  Filled 2015-10-05: qty 2

## 2015-10-05 MED ORDER — DIPHENHYDRAMINE HCL 50 MG/ML IJ SOLN: 25.0000 mg | Freq: Four times a day (QID) | INTRAMUSCULAR | Status: DC | PRN

## 2015-10-05 MED ORDER — IOPAMIDOL (ISOVUE-300) INJECTION 61%
INTRAVENOUS | Status: AC
  Administered 2015-10-05: 75 mL via INTRAVENOUS
  Filled 2015-10-05: qty 100

## 2015-10-05 MED ORDER — ONDANSETRON 4 MG PO TBDP: 4.0000 mg | ORAL_TABLET | Freq: Four times a day (QID) | ORAL | Status: DC | PRN

## 2015-10-05 MED ORDER — 0.9 % SODIUM CHLORIDE (POUR BTL) OPTIME
TOPICAL | Status: DC | PRN
  Administered 2015-10-05: 1000 mL

## 2015-10-05 MED ORDER — DIPHENHYDRAMINE HCL 25 MG PO CAPS: 25.0000 mg | ORAL_CAPSULE | Freq: Four times a day (QID) | ORAL | Status: DC | PRN

## 2015-10-05 MED ORDER — OXYCODONE HCL 5 MG PO TABS: 5.0000 mg | ORAL_TABLET | Freq: Once | ORAL | Status: DC | PRN

## 2015-10-05 MED ORDER — PROMETHAZINE HCL 25 MG/ML IJ SOLN: 6.2500 mg | INTRAMUSCULAR | Status: DC | PRN

## 2015-10-05 MED ORDER — LIDOCAINE 2% (20 MG/ML) 5 ML SYRINGE
INTRAMUSCULAR | Status: DC | PRN
  Administered 2015-10-05: 60 mg via INTRAVENOUS

## 2015-10-05 MED ORDER — LIDOCAINE 2% (20 MG/ML) 5 ML SYRINGE
INTRAMUSCULAR | Status: AC
  Filled 2015-10-05: qty 5

## 2015-10-05 MED ORDER — LACTATED RINGERS IV SOLN
INTRAVENOUS | Status: DC | PRN
  Administered 2015-10-05: 17:00:00 via INTRAVENOUS

## 2015-10-05 MED ORDER — FENTANYL CITRATE (PF) 100 MCG/2ML IJ SOLN
INTRAMUSCULAR | Status: AC
  Filled 2015-10-05: qty 2

## 2015-10-05 MED ORDER — MENTHOL 3 MG MT LOZG
LOZENGE | OROMUCOSAL | Status: AC
  Filled 2015-10-05: qty 9

## 2015-10-05 MED ORDER — BUPIVACAINE HCL (PF) 0.25 % IJ SOLN
INTRAMUSCULAR | Status: AC
  Filled 2015-10-05: qty 30

## 2015-10-05 MED ORDER — DEXAMETHASONE SODIUM PHOSPHATE 10 MG/ML IJ SOLN
INTRAMUSCULAR | Status: AC
Start: 2015-10-05 — End: 2015-10-05
  Filled 2015-10-05: qty 1

## 2015-10-05 MED ORDER — OXYCODONE HCL 5 MG/5ML PO SOLN: 5.0000 mg | Freq: Once | ORAL | Status: DC | PRN

## 2015-10-05 MED ORDER — ENOXAPARIN SODIUM 40 MG/0.4ML ~~LOC~~ SOLN
40.0000 mg | SUBCUTANEOUS | Status: DC
  Administered 2015-10-06: 40 mg via SUBCUTANEOUS
  Filled 2015-10-05: qty 0.4

## 2015-10-05 MED ORDER — MIDAZOLAM HCL 2 MG/2ML IJ SOLN
INTRAMUSCULAR | Status: AC
  Filled 2015-10-05: qty 2

## 2015-10-05 MED ORDER — ONDANSETRON HCL 4 MG/2ML IJ SOLN
INTRAMUSCULAR | Status: DC | PRN
  Administered 2015-10-05: 4 mg via INTRAVENOUS

## 2015-10-05 MED ORDER — MIDAZOLAM HCL 2 MG/2ML IJ SOLN
INTRAMUSCULAR | Status: DC | PRN
  Administered 2015-10-05: 2 mg via INTRAVENOUS

## 2015-10-05 MED ORDER — PROPOFOL 10 MG/ML IV BOLUS
INTRAVENOUS | Status: AC
  Filled 2015-10-05: qty 20

## 2015-10-05 MED ORDER — METHOCARBAMOL 500 MG PO TABS: 500.0000 mg | ORAL_TABLET | Freq: Four times a day (QID) | ORAL | Status: DC | PRN

## 2015-10-05 MED ORDER — PIPERACILLIN-TAZOBACTAM 3.375 G IVPB 30 MIN
3.3750 g | INTRAVENOUS | Status: AC
  Administered 2015-10-05: 3.375 g via INTRAVENOUS
  Filled 2015-10-05: qty 50

## 2015-10-05 MED ORDER — SODIUM CHLORIDE 0.9 % IV SOLN
INTRAVENOUS | Status: DC
  Administered 2015-10-05: 1 mL via INTRAVENOUS
  Administered 2015-10-05: 19:00:00 via INTRAVENOUS

## 2015-10-05 MED ORDER — HYDROMORPHONE HCL 1 MG/ML IJ SOLN
1.0000 mg | INTRAMUSCULAR | Status: DC | PRN
Start: 2015-10-05 — End: 2015-10-06

## 2015-10-05 MED ORDER — FENTANYL CITRATE (PF) 100 MCG/2ML IJ SOLN: 25.0000 ug | INTRAMUSCULAR | Status: DC | PRN

## 2015-10-05 MED ORDER — PROPOFOL 10 MG/ML IV BOLUS
INTRAVENOUS | Status: DC | PRN
  Administered 2015-10-05: 180 mg via INTRAVENOUS

## 2015-10-05 MED ORDER — FENTANYL CITRATE (PF) 100 MCG/2ML IJ SOLN
INTRAMUSCULAR | Status: DC | PRN
  Administered 2015-10-05: 100 ug via INTRAVENOUS

## 2015-10-05 SURGICAL SUPPLY — 27 items
BNDG GAUZE ELAST 4 BULKY (GAUZE/BANDAGES/DRESSINGS) IMPLANT
COVER MAYO STAND STRL (DRAPES) ×3 IMPLANT
COVER SURGICAL LIGHT HANDLE (MISCELLANEOUS) ×3 IMPLANT
ELECT CAUTERY BLADE 6.4 (BLADE) ×3 IMPLANT
ELECT REM PT RETURN 9FT ADLT (ELECTROSURGICAL) ×3
ELECTRODE REM PT RTRN 9FT ADLT (ELECTROSURGICAL) ×1 IMPLANT
GAUZE SPONGE 4X4 12PLY STRL (GAUZE/BANDAGES/DRESSINGS) ×2 IMPLANT
GLOVE BIO SURGEON STRL SZ7 (GLOVE) ×3 IMPLANT
GLOVE BIOGEL PI IND STRL 7.5 (GLOVE) ×1 IMPLANT
GLOVE BIOGEL PI INDICATOR 7.5 (GLOVE) ×2
GOWN STRL REUS W/ TWL LRG LVL3 (GOWN DISPOSABLE) ×2 IMPLANT
GOWN STRL REUS W/TWL LRG LVL3 (GOWN DISPOSABLE) ×6
KIT BASIN OR (CUSTOM PROCEDURE TRAY) ×3 IMPLANT
KIT ROOM TURNOVER OR (KITS) ×3 IMPLANT
NS IRRIG 1000ML POUR BTL (IV SOLUTION) ×3 IMPLANT
PACK LITHOTOMY IV (CUSTOM PROCEDURE TRAY) ×3 IMPLANT
PAD ARMBOARD 7.5X6 YLW CONV (MISCELLANEOUS) ×3 IMPLANT
PENCIL BUTTON HOLSTER BLD 10FT (ELECTRODE) ×3 IMPLANT
SPONGE LAP 18X18 X RAY DECT (DISPOSABLE) ×3 IMPLANT
SPONGE SURGIFOAM ABS GEL 12-7 (HEMOSTASIS) ×2 IMPLANT
SURGILUBE 2OZ TUBE FLIPTOP (MISCELLANEOUS) ×3 IMPLANT
SYR BULB 3OZ (MISCELLANEOUS) ×3 IMPLANT
TOWEL OR 17X24 6PK STRL BLUE (TOWEL DISPOSABLE) ×3 IMPLANT
TOWEL OR 17X26 10 PK STRL BLUE (TOWEL DISPOSABLE) ×3 IMPLANT
TUBE CONNECTING 12'X1/4 (SUCTIONS) ×1
TUBE CONNECTING 12X1/4 (SUCTIONS) ×2 IMPLANT
YANKAUER SUCT BULB TIP NO VENT (SUCTIONS) ×3 IMPLANT

## 2015-10-05 NOTE — ED Provider Notes (Signed)
[facility]-EMERGENCY DEPT Provider Note   CSN: [id number] Arrival date & time: 10/05/15  1019     History   Chief Complaint Chief Complaint  Patient presents with  . Hemorrhoids    HPI Nathan Hickman is a 45 y.o. male.  HPI  45 year old male who presents with a weeklong history of perirectal pain. Patient states his symptoms started as mild pain and swelling of his perianal area earlier this week. The pain was made worse with any movement as well as bowel movements. He presented to his primary care doctor for these symptoms and was given hydrocortisone cream. Since then his symptoms have worsened. He presented again to an urgent care and was given topical lidocaine. He states his symptoms are not improved. He  has persistent severe, 10 out of 10, aching perianal pain. He has been unable to walk today due to this pain. Denies any fevers but has had some chills. No nausea or vomiting. No history of similar symptoms. No history of constipation   History reviewed. No pertinent past medical history.  There are no active problems to display for this patient.   History reviewed. No pertinent surgical history.     Home Medications    Prior to Admission medications   Medication Sig Start Date End Date Taking? Authorizing Provider  diltiazem 2 % GEL Apply a pea-sized amount TID Patient taking differently: Apply 1 application topically 3 (three) times daily. pea-sized amount 10/04/15  Yes Elizabeth Whitney McVey, PA-C  hydrocortisone (ANUSOL-HC) 2.5 % rectal cream Place 1 application rectally 2 (two) times daily. 10/03/15  Yes Shade Flood, MD  ibuprofen (ADVIL,MOTRIN) 200 MG tablet Take 600 mg by mouth every 3 (three) hours as needed for mild pain.    Yes Historical Provider, MD  lidocaine (XYLOCAINE) 2 % jelly Apply 1 application topically 3 (three) times daily as needed. To rectal area as needed. Label in spanish. 10/03/15  Yes Shade Flood, MD    Family History History reviewed.  No pertinent family history.  Social History Social History  Substance Use Topics  . Smoking status: Never Smoker  . Smokeless tobacco: Never Used  . Alcohol use No     Allergies   Review of patient's allergies indicates no known allergies.   Review of Systems Review of Systems  Constitutional: Negative for chills, fatigue and fever.  HENT: Negative for congestion and rhinorrhea.   Eyes: Negative for visual disturbance.  Respiratory: Negative for cough, shortness of breath and wheezing.   Cardiovascular: Negative for chest pain and leg swelling.  Gastrointestinal: Negative for abdominal pain, blood in stool, nausea and vomiting.  Genitourinary: Negative for dysuria and flank pain.  Musculoskeletal: Negative for neck pain and neck stiffness.  Skin: Negative for rash and wound.  Allergic/Immunologic: Negative for immunocompromised state.  Neurological: Negative for syncope, weakness and headaches.  All other systems reviewed and are negative.    Physical Exam Updated Vital Signs BP 127/84   Pulse 73   Temp 98.3 F (36.8 C) (Oral)   Resp 17   Ht 5\' 8"  (1.727 m)   Wt 212 lb (96.2 kg)   SpO2 96%   BMI 32.23 kg/m   Physical Exam  Constitutional: He is oriented to person, place, and time. He appears well-developed and well-nourished. No distress.  HENT:  Head: Normocephalic and atraumatic.  Eyes: Conjunctivae are normal.  Neck: Neck supple.  Cardiovascular: Normal rate, regular rhythm and normal heart sounds.  Exam reveals no friction rub.  No murmur heard. Pulmonary/Chest: Effort normal and breath sounds normal. No respiratory distress. He has no wheezes. He has no rales.  Abdominal: Soft. Bowel sounds are normal. He exhibits no distension.  Genitourinary:  Genitourinary Comments: Large fluctuant perirectal abscess. Exquisite tenderness with any manipulation of the buttocks. Rectal exam deferred due to pain.  Musculoskeletal: He exhibits no edema.  Neurological:  He is alert and oriented to person, place, and time. He exhibits normal muscle tone.  Skin: Skin is warm. Capillary refill takes less than 2 seconds.  Psychiatric: He has a normal mood and affect.  Nursing note and vitals reviewed.    ED Treatments / Results  Labs (all labs ordered are listed, but only abnormal results are displayed) Labs Reviewed  CBC WITH DIFFERENTIAL/PLATELET - Abnormal; Notable for the following:       Result Value   WBC 10.9 (*)    Neutro Abs 8.4 (*)    All other components within normal limits  COMPREHENSIVE METABOLIC PANEL - Abnormal; Notable for the following:    Glucose, Bld 109 (*)    All other components within normal limits  I-STAT CG4 LACTIC ACID, ED  I-STAT CG4 LACTIC ACID, ED    EKG  EKG Interpretation None       Radiology Ct Pelvis W Contrast  Result Date: 10/05/2015 CLINICAL DATA:  Rectal pain and hemorrhoids. EXAM: CT PELVIS WITH CONTRAST TECHNIQUE: Multidetector CT imaging of the pelvis was performed using the standard protocol following the bolus administration of intravenous contrast. CONTRAST:  75 mL ISOVUE-300 IOPAMIDOL (ISOVUE-300) INJECTION 61% COMPARISON:  12/09/2007 FINDINGS: Urinary Tract:  The bladder appears unremarkable. Bowel: Focal fluid collection at the pelvic floor adjacent to the distal rectum and anal region measures approximately 1.3 x 2.2 x 3.0 cm. This likely represents a small perirectal/perianal abscess. No evidence of bowel perforation or obstruction in the pelvis. Vascular/Lymphatic: No enlarged lymph nodes is seen. No evidence of hernia. Musculoskeletal: Visualized bony structures are unremarkable. IMPRESSION: Small fluid collection adjacent to the distal rectum and anus measuring 3 cm in maximum diameter. This likely represents a small perirectal/perianal abscess. Electronically Signed   By: Irish LackGlenn  Yamagata M.D.   On: 10/05/2015 14:20    Procedures Procedures (including critical care time)  Medications Ordered in  ED Medications  HYDROmorphone (DILAUDID) injection 1 mg (1 mg Intravenous Given 10/05/15 1337)  iopamidol (ISOVUE-300) 61 % injection (75 mLs Intravenous Contrast Given 10/05/15 1336)  HYDROmorphone (DILAUDID) injection 1 mg (1 mg Intravenous Given 10/05/15 1600)  piperacillin-tazobactam (ZOSYN) IVPB 3.375 g (3.375 g Intravenous Given 10/05/15 1651)     Initial Impression / Assessment and Plan / ED Course  I have reviewed the triage vital signs and the nursing notes.  Pertinent labs & imaging results that were available during my care of the patient were reviewed by me and considered in my medical decision making (see chart for details).  45 year old male with no significant past medical history presents with anal and perineal pain. Patient diagnosed with hemorrhoid this week, but has failed outpatient treatment. On arrival here, he is in exquisite pain. His exam is concerning for perirectal abscess. Labwork shows mild leukocytosis, will subsequently obtain CT scan for further assessment.  CT scan confirms perianal and perirectal abscess. Consulted Dr. Corliss Skainssuei, who has evaluated in ED. Will take to OR for further assessment.  Final Clinical Impressions(s) / ED Diagnoses   Final diagnoses:  Perianal abscess  Perirectal abscess    New Prescriptions Current Discharge Medication List  Shaune Pollack, MD 10/05/15 [phone]

## 2015-10-05 NOTE — H&P (Signed)
Nathan Hickman is an 45 y.o. male.   Chief Complaint: Perirectal abscess HPI: 45 yo male in good health presents with a five day history of worsening perirectal pain.  He was seen at [facility] clinic and was treated for anal fissure.  The pain and swelling has worsened to the point that he cannot walk or sit.  History reviewed. No pertinent past medical history.  History reviewed. No pertinent surgical history.  History reviewed. No pertinent family history. Social History:  reports that he has never smoked. He has never used smokeless tobacco. He reports that he does not drink alcohol or use drugs.  Allergies: No Known Allergies  Prior to Admission medications   Medication Sig Start Date End Date Taking? Authorizing Provider  diltiazem 2 % GEL Apply a pea-sized amount TID Patient taking differently: Apply 1 application topically 3 (three) times daily. pea-sized amount 10/04/15  Yes Elizabeth Whitney McVey, PA-C  hydrocortisone (ANUSOL-HC) 2.5 % rectal cream Place 1 application rectally 2 (two) times daily. 10/03/15  Yes Wendie Agreste, MD  ibuprofen (ADVIL,MOTRIN) 200 MG tablet Take 600 mg by mouth every 3 (three) hours as needed for mild pain.    Yes Historical Provider, MD  lidocaine (XYLOCAINE) 2 % jelly Apply 1 application topically 3 (three) times daily as needed. To rectal area as needed. Label in Orange. 10/03/15  Yes Wendie Agreste, MD     Results for orders placed or performed during the hospital encounter of 10/05/15 (from the past 48 hour(s))  CBC with Differential     Status: Abnormal   Collection Time: 10/05/15 11:47 AM  Result Value Ref Range   WBC 10.9 (H) 4.0 - 10.5 K/uL   RBC 4.90 4.22 - 5.81 MIL/uL   Hemoglobin 15.3 13.0 - 17.0 g/dL   HCT 43.7 39.0 - 52.0 %   MCV 89.2 78.0 - 100.0 fL   MCH 31.2 26.0 - 34.0 pg   MCHC 35.0 30.0 - 36.0 g/dL   RDW 12.3 11.5 - 15.5 %   Platelets 200 150 - 400 K/uL   Neutrophils Relative % 77 %   Neutro Abs 8.4 (H) 1.7 - 7.7 K/uL   Lymphocytes Relative 14 %   Lymphs Abs 1.5 0.7 - 4.0 K/uL   Monocytes Relative 9 %   Monocytes Absolute 0.9 0.1 - 1.0 K/uL   Eosinophils Relative 0 %   Eosinophils Absolute 0.0 0.0 - 0.7 K/uL   Basophils Relative 0 %   Basophils Absolute 0.0 0.0 - 0.1 K/uL  Comprehensive metabolic panel     Status: Abnormal   Collection Time: 10/05/15 11:47 AM  Result Value Ref Range   Sodium 138 135 - 145 mmol/L   Potassium 4.1 3.5 - 5.1 mmol/L   Chloride 105 101 - 111 mmol/L   CO2 26 22 - 32 mmol/L   Glucose, Bld 109 (H) 65 - 99 mg/dL   BUN 10 6 - 20 mg/dL   Creatinine, Ser 0.79 0.61 - 1.24 mg/dL   Calcium 8.9 8.9 - 10.3 mg/dL   Total Protein 7.1 6.5 - 8.1 g/dL   Albumin 3.7 3.5 - 5.0 g/dL   AST 16 15 - 41 U/L   ALT 19 17 - 63 U/L   Alkaline Phosphatase 77 38 - 126 U/L   Total Bilirubin 0.7 0.3 - 1.2 mg/dL   GFR calc non Af Amer >60 >60 mL/min   GFR calc Af Amer >60 >60 mL/min    Comment: (NOTE) The eGFR has been  calculated using the CKD EPI equation. This calculation has not been validated in all clinical situations. eGFR's persistently <60 mL/min signify possible Chronic Kidney Disease.    Anion gap 7 5 - 15  I-Stat CG4 Lactic Acid, ED     Status: None   Collection Time: 10/05/15 12:02 PM  Result Value Ref Range   Lactic Acid, Venous 1.15 0.5 - 1.9 mmol/L  I-Stat CG4 Lactic Acid, ED     Status: None   Collection Time: 10/05/15  3:00 PM  Result Value Ref Range   Lactic Acid, Venous 0.95 0.5 - 1.9 mmol/L   Ct Pelvis W Contrast  Result Date: 10/05/2015 CLINICAL DATA:  Rectal pain and hemorrhoids. EXAM: CT PELVIS WITH CONTRAST TECHNIQUE: Multidetector CT imaging of the pelvis was performed using the standard protocol following the bolus administration of intravenous contrast. CONTRAST:  75 mL ISOVUE-300 IOPAMIDOL (ISOVUE-300) INJECTION 61% COMPARISON:  12/09/2007 FINDINGS: Urinary Tract:  The bladder appears unremarkable. Bowel: Focal fluid collection at the pelvic floor adjacent to the  distal rectum and anal region measures approximately 1.3 x 2.2 x 3.0 cm. This likely represents a small perirectal/perianal abscess. No evidence of bowel perforation or obstruction in the pelvis. Vascular/Lymphatic: No enlarged lymph nodes is seen. No evidence of hernia. Musculoskeletal: Visualized bony structures are unremarkable. IMPRESSION: Small fluid collection adjacent to the distal rectum and anus measuring 3 cm in maximum diameter. This likely represents a small perirectal/perianal abscess. Electronically Signed   By: Aletta Edouard M.D.   On: 10/05/2015 14:20    Review of Systems  Constitutional: Negative for weight loss.  HENT: Negative for ear discharge, ear pain, hearing loss and tinnitus.   Eyes: Negative for blurred vision, double vision, photophobia and pain.  Respiratory: Negative for cough, sputum production and shortness of breath.   Cardiovascular: Negative for chest pain.  Gastrointestinal: Negative for abdominal pain, nausea and vomiting.  Genitourinary: Negative for dysuria, flank pain, frequency and urgency.       Severe perirectal pain - radiating up into pelvis; difficult to ambulate  Musculoskeletal: Negative for back pain, falls, joint pain, myalgias and neck pain.  Neurological: Negative for dizziness, tingling, sensory change, focal weakness, loss of consciousness and headaches.  Endo/Heme/Allergies: Does not bruise/bleed easily.  Psychiatric/Behavioral: Negative for depression, memory loss and substance abuse. The patient is not nervous/anxious.     Blood pressure 124/83, pulse 77, temperature 98.4 F (36.9 C), temperature source Oral, resp. rate 18, height _0  (1.727 m), weight 96.2 kg (212 lb), SpO2 97 %. Physical Exam  WDWN in NAD HEENT:  EOMI, sclera anicteric Neck:  No masses, no thyromegaly Lungs:  CTA bilaterally; normal respiratory effort CV:  Regular rate and rhythm; no murmurs Abd:  +bowel sounds, soft, non-tender, no masses Rectal:  Fluctuant  posterior perirectal abscess - exquisitely tender; pain with spreading of buttocks Ext:  Well-perfused; no edema Skin:  Warm, dry; no sign of jaundice  Assessment/Plan Perirectal abscess - 3 cm on CT scan  To OR for incision and drainage of perirectal abscess.  The surgical procedure has been discussed with the patient.  Potential risks, benefits, alternative treatments, and expected outcomes have been explained.  All of the patient's questions at this time have been answered.  The likelihood of reaching the patient's treatment goal is good.  The patient understand the proposed surgical procedure and wishes to proceed.   Maia Petties., MD 10/05/2015, 3:48 PM

## 2015-10-05 NOTE — Anesthesia Procedure Notes (Signed)
Procedure Name: Intubation Date/Time: 10/05/2015 4:58 PM Performed by: Alanda AmassFRIEDMAN, Tiani Stanbery A Pre-anesthesia Checklist: Patient identified, Emergency Drugs available, Suction available, Patient being monitored and Timeout performed Patient Re-evaluated:Patient Re-evaluated prior to inductionOxygen Delivery Method: Circle System Utilized and Circle system utilized Preoxygenation: Pre-oxygenation with 100% oxygen Intubation Type: IV induction Ventilation: Mask ventilation without difficulty Laryngoscope Size: Mac and 3 Grade View: Grade I Tube type: Oral Tube size: 7.5 mm Number of attempts: 1 Airway Equipment and Method: Stylet Placement Confirmation: ETT inserted through vocal cords under direct vision,  positive ETCO2 and breath sounds checked- equal and bilateral Secured at: 22 cm Tube secured with: Tape Dental Injury: Teeth and Oropharynx as per pre-operative assessment

## 2015-10-05 NOTE — Anesthesia Postprocedure Evaluation (Signed)
Anesthesia Post Note  Patient: Nathan Hickman  Procedure(s) Performed: Procedure(s) (LRB): IRRIGATION AND DEBRIDEMENT PERIRECTAL ABSCESS (N/A)  Patient location during evaluation: PACU Anesthesia Type: General Level of consciousness: awake and alert Pain management: pain level controlled Vital Signs Assessment: post-procedure vital signs reviewed and stable Respiratory status: spontaneous breathing, nonlabored ventilation, respiratory function stable and patient connected to nasal cannula oxygen Cardiovascular status: blood pressure returned to baseline and stable Postop Assessment: no signs of nausea or vomiting Anesthetic complications: no    Last Vitals:  Vitals:   10/05/15 1745 10/05/15 1800  BP:  (!) 158/77  Pulse:  74  Resp:  16  Temp: 36.2 C     Last Pain:  Vitals:   10/05/15 1800  TempSrc:   PainSc: 0-No pain                 [name] S

## 2015-10-05 NOTE — Anesthesia Preprocedure Evaluation (Signed)
Anesthesia Evaluation  Patient identified by MRN, date of birth, ID band Patient awake    Reviewed: Allergy & Precautions, NPO status , Patient's Chart, lab work & pertinent test results  Airway Mallampati: II  TM Distance: >3 FB Neck ROM: Full    Dental no notable dental hx.    Pulmonary neg pulmonary ROS,    Pulmonary exam normal breath sounds clear to auscultation       Cardiovascular negative cardio ROS Normal cardiovascular exam Rhythm:Regular Rate:Normal     Neuro/Psych negative neurological ROS  negative psych ROS   GI/Hepatic negative GI ROS, Neg liver ROS,   Endo/Other  negative endocrine ROS  Renal/GU negative Renal ROS  negative genitourinary   Musculoskeletal negative musculoskeletal ROS (+)   Abdominal   Peds negative pediatric ROS (+)  Hematology negative hematology ROS (+)   Anesthesia Other Findings   Reproductive/Obstetrics negative OB ROS                             Anesthesia Physical Anesthesia Plan  ASA: I  Anesthesia Plan: General   Post-op Pain Management:    Induction: Intravenous  Airway Management Planned: LMA and Oral ETT  Additional Equipment:   Intra-op Plan:   Post-operative Plan: Extubation in OR  Informed Consent: I have reviewed the patients History and Physical, chart, labs and discussed the procedure including the risks, benefits and alternatives for the proposed anesthesia with the patient or authorized representative who has indicated his/her understanding and acceptance.   Dental advisory given  Plan Discussed with: CRNA and Surgeon  Anesthesia Plan Comments:         Anesthesia Quick Evaluation  

## 2015-10-05 NOTE — ED Notes (Signed)
Dr. Tsuei at bedside.  

## 2015-10-05 NOTE — ED Notes (Signed)
Meds taken to CT b/c pain could not tolerate CT

## 2015-10-05 NOTE — Op Note (Signed)
Pre-op Diagnosis:  Perirectal abscess Post-op Diagnosis:  Same Procedure:  Incision and drainage of perirectal abscess Surgeon:  Manus RuddSUEI,Crytal Pensinger K. Anesthesia:  GETT Indications:  This is a 45 yo male who presents with five days of worsening pain and swelling near his rectum.  He was treated for hemorrhoid disease, but the pain has worsened.  He presents now for evaluation.  CT scan showed a perirectal abscess.  Description of procedure:  The patient was brought to the operating room and placed in supine position on the operating table. After an adequate level general anesthesia was obtained, his legs were placed in lithotomy position in yellowfin stirrups. His perineum was prepped with Betadine and draped sterile fashion. A timeout was taken to ensure the proper patient and proper procedure. The patient has a large fluctuant abscess that is easily visible just anterior to the rectum to the right of midline. We incised this area with a scalpel and a large amount of foul-smelling purulent fluid was encountered. We cultured this and drain the abscess completely. The abscess is fairly well localized measuring about 2-1/2 cm deep and about 2 cm wide. We excised some of the overlying skin to keep the wound open. We irrigated the wound thoroughly. Cautery was used for hemostasis. We injected around the wound with 0.25% Marcaine. The wound was packed with Gelfoam. A dry dressing was applied. The patient was extubated and brought to the recovery room in stable condition. All sponge, instrument, and needle counts are correct.  Wilmon ArmsMatthew K. Corliss Skainssuei, MD, [facility]  General/ Trauma Surgery  10/05/2015 5:58 PM

## 2015-10-05 NOTE — ED Triage Notes (Signed)
C/o rectal pain states he has hemorrhoids. X 1 week pain is getting worse

## 2015-10-05 NOTE — Transfer of Care (Signed)
Immediate Anesthesia Transfer of Care Note  Patient: Nathan Hickman  Procedure(s) Performed: Procedure(s): IRRIGATION AND DEBRIDEMENT PERIRECTAL ABSCESS (N/A)  Patient Location: PACU  Anesthesia Type:General  Level of Consciousness: awake  Airway & Oxygen Therapy: Patient Spontanous Breathing  Post-op Assessment: Report given to RN and Post -op Vital signs reviewed and stable  Post vital signs: Reviewed and stable  Last Vitals:  Vitals:   10/05/15 1600 10/05/15 1604  BP: 127/84   Pulse: 73   Resp: 17   Temp:  36.8 C    Last Pain:  Vitals:   10/05/15 1604  TempSrc: Oral  PainSc:          Complications: No apparent anesthesia complications

## 2015-10-06 MED ORDER — OXYCODONE HCL 5 MG PO TABS: 5.0000 mg | ORAL_TABLET | Freq: Four times a day (QID) | ORAL | 0 refills | Status: DC | PRN

## 2015-10-06 MED ORDER — DOCUSATE SODIUM 100 MG PO CAPS: 100.0000 mg | ORAL_CAPSULE | Freq: Two times a day (BID) | ORAL | 0 refills | Status: DC

## 2015-10-06 NOTE — Progress Notes (Signed)
Sitz bath given to pt. Prior to d/c. No packing in wound. Recovered wound with gauze and mesh panties. Taught pt. How to do it himself. He stated understanding. I provided the pt. With extra guaze and mesh panties and also the sitz bath materials to go home with.   D/C papers were gone over with pt. And significant other. NO questions/complaints. Prescription given to pt. IV taken out. Pt. D/c'd successfully with his significant other.

## 2015-10-06 NOTE — Discharge Instructions (Signed)
ANORECTAL SURGERY: POST OP INSTRUCTIONS °1. Take your usually prescribed home medications unless otherwise directed. °2. DIET: During the first few hours after surgery sip on some liquids until you are able to urinate.  It is normal to not urinate for several hours after this surgery.  If you feel uncomfortable, please contact the office for instructions.  After you are able to urinate,you may eat, if you feel like it.  Follow a light bland diet the first 24 hours after arrival home, such as soup, liquids, crackers, etc.  Be sure to include lots of fluids daily (6-8 glasses).  Avoid fast food or heavy meals, as your are more likely to get nauseated.  Eat a low fat diet the next few days after surgery.  Limit caffeine intake to 1-2 servings a day. °3. PAIN CONTROL: °a. Pain is best controlled by a usual combination of several different methods TOGETHER: °i. Muscle relaxation °1.  Soak in a warm bath (or Sitz bath) three times a day and after bowel movements.  Continue to do this until all pain is resolved. °ii. Over the counter pain medication °iii. Prescription pain medication °b. Most patients will experience some swelling and discomfort in the anus/rectal area and incisions.  Heat such as warm towels, sitz baths, warm baths, etc to help relax tight/sore spots and speed recovery.  Some people prefer to use ice, especially in the first couple days after surgery, as it may decrease the pain and swelling, or alternate between ice & heat.  Experiment to what works for you.  Swelling and bruising can take several weeks to resolve.  Pain can take even longer to completely resolve. °c. It is helpful to take an over-the-counter pain medication regularly for the first few weeks.  Choose one of the following that works best for you: °i. Naproxen (Aleve, etc)  Two 220mg tabs twice a day °ii. Ibuprofen (Advil, etc) Three 200mg tabs four times a day (every meal & bedtime) °d. A  prescription for pain medication (such as  percocet, oxycodone, hydrocodone, etc) should be given to you upon discharge.  Take your pain medication as prescribed.  °i. If you are having problems/concerns with the prescription medicine (does not control pain, nausea, vomiting, rash, itching, etc), please call us [phone] to see if we need to switch you to a different pain medicine that will work better for you and/or control your side effect better. °ii. If you need a refill on your pain medication, please contact your pharmacy.  They will contact our office to request authorization. Prescriptions will not be filled after 5 pm or on week-ends. °4. KEEP YOUR BOWELS REGULAR and AVOID CONSTIPATION °a. The goal is one to two soft bowel movements a day.  You should at least have a bowel movement every other day. °b. Avoid getting constipated.  Between the surgery and the pain medications, it is common to experience some constipation. This can be very painful after rectal surgery.  Increasing fluid intake and taking a fiber supplement (such as Metamucil, Citrucel, FiberCon, etc) 1-2 times a day regularly will usually help prevent this problem from occurring.  A stool softener like colace is also recommended.  This can be purchased over the counter at your pharmacy.  You can take it up to 3 times a day.  If you do not have a bowel movement after 24 hrs since your surgery, take one does of milk of magnesia.  If you still haven't had a bowel movement 8-12   hours after that dose, take another dose.  If you don't have a bowel movement 48 hrs after surgery, purchase a Fleets enema from the drug store and administer gently per package instructions.  If you still are having trouble with your bowel movements after that, please call the office for further instructions. °c. If you develop diarrhea or have many loose bowel movements, simplify your diet to bland foods & liquids for a few days.  Stop any stool softeners and decrease your fiber supplement.  Switching to mild  anti-diarrheal medications (Kayopectate, Pepto Bismol) can help.  If this worsens or does not improve, please call us. ° °5. Wound Care °a. Remove your bandages before your first bowel movement or 8 hours after surgery.     °b. Remove any wound packing material at this tim,e as well.  You do not need to repack the wound unless instructed otherwise.  Wear an absorbent pad or soft cotton gauze in your underwear to catch any drainage and help keep the area clean. You should change this every 2-3 hours while awake. °c. Keep the area clean and dry.  Bathe / shower every day, especially after bowel movements.  Keep the area clean by showering / bathing over the incision / wound.   It is okay to soak an open wound to help wash it.  Wet wipes or showers / gentle washing after bowel movements is often less traumatic than regular toilet paper. °d. You may have some styrofoam-like soft packing in the rectum which will come out with the first bowel movement.  °e. You will often notice bleeding with bowel movements.  This should slow down by the end of the first week of surgery °f. Expect some drainage.  This should slow down, too, by the end of the first week of surgery.  Wear an absorbent pad or soft cotton gauze in your underwear until the drainage stops. °g. Do Not sit on a rubber or pillow ring.  This can make you symptoms worse.  You may sit on a soft pillow if needed.  °6. ACTIVITIES as tolerated:   °a. You may resume regular (light) daily activities beginning the next day--such as daily self-care, walking, climbing stairs--gradually increasing activities as tolerated.  If you can walk 30 minutes without difficulty, it is safe to try more intense activity such as jogging, treadmill, bicycling, low-impact aerobics, swimming, etc. °b. Save the most intensive and strenuous activity for last such as sit-ups, heavy lifting, contact sports, etc  Refrain from any heavy lifting or straining until you are off narcotics for pain  control.   °c. You may drive when you are no longer taking prescription pain medication, you can comfortably sit for long periods of time, and you can safely maneuver your car and apply brakes. °d. You may have sexual intercourse when it is comfortable.  °7. FOLLOW UP in our office °a. Please call [facility] at [phone] to set up an appointment to see your surgeon in the office for a follow-up appointment approximately 3-4 weeks after your surgery. °b. Make sure that you call for this appointment the day you arrive home to insure a convenient appointment time. °10. IF YOU HAVE DISABILITY OR FAMILY LEAVE FORMS, BRING THEM TO THE OFFICE FOR PROCESSING.  DO NOT GIVE THEM TO YOUR DOCTOR. ° ° ° ° °WHEN TO CALL US [phone]: °1. Poor pain control °2. Reactions / problems with new medications (rash/itching, nausea, etc)  °3. Fever over 101.5 F (38.5   C) °4. Inability to urinate °5. Nausea and/or vomiting °6. Worsening swelling or bruising °7. Continued bleeding from incision. °8. Increased pain, redness, or drainage from the incision ° °The clinic staff is available to answer your questions during regular business hours (8:30am-5pm).  Please don’t hesitate to call and ask to speak to one of our nurses for clinical concerns.   A surgeon from [facility] is always on call at the hospitals °  °If you have a medical emergency, go to the nearest emergency room or call 911. °  ° °[facility] °[address], [address], , [place]  [address] ? °MAIN: [phone] ? TOLL FREE: [phone] ? °FAX [phone] °[email or web address] ° ° ° ° °

## 2015-10-06 NOTE — Discharge Summary (Signed)
Patient ID: Sharlyn BolognaJose E Espitia [id number] 44 y.o. 11/03/1970  10/05/2015  Discharge date and time: No discharge date for patient encounter.  Admitting Physician: [facility] MD  Discharge Physician: [facility] MD  Admission Diagnoses: perirectal  abscess  Discharge Diagnoses: same  Operations: Procedure(s): IRRIGATION AND DEBRIDEMENT PERIRECTAL ABSCESS    Discharged Condition: good    Hospital Course: Pain controlled overnight.  Gelfoam packing removed in AM  Consults: None  Significant Diagnostic Studies: labs: cbc, chemistry  Treatments: surgery: I&D  Disposition: Home

## 2015-10-07 ENCOUNTER — Encounter (HOSPITAL_COMMUNITY): Payer: Self-pay | Admitting: Surgery

## 2015-10-07 LAB — AEROBIC/ANAEROBIC CULTURE W GRAM STAIN (SURGICAL/DEEP WOUND)

## 2015-10-07 LAB — AEROBIC/ANAEROBIC CULTURE (SURGICAL/DEEP WOUND)

## 2015-12-02 ENCOUNTER — Other Ambulatory Visit: Payer: Self-pay | Admitting: General Surgery

## 2015-12-16 ENCOUNTER — Encounter (HOSPITAL_BASED_OUTPATIENT_CLINIC_OR_DEPARTMENT_OTHER): Payer: Self-pay | Admitting: *Deleted

## 2015-12-16 NOTE — Progress Notes (Signed)
[facility] [phone].  PT VERBALIZED UNDERSTANDING OF INSTRUCTIONS THRU INTERPRETER.  NPO AFTER MN.  ARRIVE AT 1015.  NEEDS HG.  PT SISTER, LUCRESIA ESPITIA , WILL BE INTERPRETING FOR PT DOS.

## 2015-12-19 ENCOUNTER — Ambulatory Visit (HOSPITAL_BASED_OUTPATIENT_CLINIC_OR_DEPARTMENT_OTHER): Payer: Self-pay | Admitting: Anesthesiology

## 2015-12-19 ENCOUNTER — Encounter (HOSPITAL_BASED_OUTPATIENT_CLINIC_OR_DEPARTMENT_OTHER): Payer: Self-pay

## 2015-12-19 ENCOUNTER — Encounter (HOSPITAL_BASED_OUTPATIENT_CLINIC_OR_DEPARTMENT_OTHER)
Admission: RE | Disposition: A | Discharge status: Home / Self Care | Payer: Self-pay | Source: Ambulatory Visit | Attending: General Surgery

## 2015-12-19 ENCOUNTER — Ambulatory Visit (HOSPITAL_BASED_OUTPATIENT_CLINIC_OR_DEPARTMENT_OTHER)
Admission: RE | Admit: 2015-12-19 | Discharge: 2015-12-19 | Disposition: A | Discharge status: Home / Self Care | Payer: Self-pay | Source: Ambulatory Visit | Attending: General Surgery | Admitting: General Surgery

## 2015-12-19 DIAGNOSIS — K603 Anal fistula: Secondary | ICD-10-CM | POA: Insufficient documentation

## 2015-12-19 HISTORY — DX: Anal fistula, unspecified: K60.30

## 2015-12-19 HISTORY — PX: RECTAL EXAM UNDER ANESTHESIA: SHX6399

## 2015-12-19 HISTORY — DX: Anal fistula: K60.3

## 2015-12-19 LAB — POCT HEMOGLOBIN-HEMACUE: Hemoglobin: 16.3 g/dL (ref 13.0–17.0)

## 2015-12-19 SURGERY — EXAM UNDER ANESTHESIA, RECTUM
Anesthesia: Monitor Anesthesia Care | Site: Rectum

## 2015-12-19 MED ORDER — GLYCOPYRROLATE 0.2 MG/ML IV SOSY
PREFILLED_SYRINGE | INTRAVENOUS | Status: AC
  Filled 2015-12-19: qty 3

## 2015-12-19 MED ORDER — ONDANSETRON HCL 4 MG/2ML IJ SOLN
INTRAMUSCULAR | Status: AC
  Filled 2015-12-19: qty 2

## 2015-12-19 MED ORDER — ACETAMINOPHEN 650 MG RE SUPP
650.0000 mg | RECTAL | Status: DC | PRN
  Filled 2015-12-19: qty 1

## 2015-12-19 MED ORDER — METOCLOPRAMIDE HCL 5 MG/ML IJ SOLN
10.0000 mg | Freq: Once | INTRAMUSCULAR | Status: DC | PRN
  Filled 2015-12-19: qty 2

## 2015-12-19 MED ORDER — DEXAMETHASONE SODIUM PHOSPHATE 10 MG/ML IJ SOLN
INTRAMUSCULAR | Status: AC
  Filled 2015-12-19: qty 1

## 2015-12-19 MED ORDER — OXYCODONE HCL 5 MG PO TABS
5.0000 mg | ORAL_TABLET | ORAL | 0 refills | Status: AC | PRN
Start: 2015-12-19 — End: ?

## 2015-12-19 MED ORDER — ROCURONIUM BROMIDE 50 MG/5ML IV SOSY
PREFILLED_SYRINGE | INTRAVENOUS | Status: AC
  Filled 2015-12-19: qty 5

## 2015-12-19 MED ORDER — EPINEPHRINE PF 1 MG/ML IJ SOLN
INTRAMUSCULAR | Status: AC
  Filled 2015-12-19: qty 1

## 2015-12-19 MED ORDER — OXYCODONE HCL 5 MG PO TABS
5.0000 mg | ORAL_TABLET | ORAL | Status: DC | PRN
  Filled 2015-12-19: qty 2

## 2015-12-19 MED ORDER — FENTANYL CITRATE (PF) 100 MCG/2ML IJ SOLN
25.0000 ug | INTRAMUSCULAR | Status: DC | PRN
  Filled 2015-12-19: qty 1

## 2015-12-19 MED ORDER — LACTATED RINGERS IV SOLN
INTRAVENOUS | Status: DC
  Filled 2015-12-19: qty 1000

## 2015-12-19 MED ORDER — BUPIVACAINE HCL (PF) 0.5 % IJ SOLN
INTRAMUSCULAR | Status: AC
  Filled 2015-12-19: qty 30

## 2015-12-19 MED ORDER — PROPOFOL 10 MG/ML IV BOLUS
INTRAVENOUS | Status: AC
  Filled 2015-12-19: qty 20

## 2015-12-19 MED ORDER — BUPIVACAINE-EPINEPHRINE 0.5% -1:200000 IJ SOLN
INTRAMUSCULAR | Status: DC | PRN
  Administered 2015-12-19: 30 mL

## 2015-12-19 MED ORDER — IBUPROFEN 200 MG PO TABS: 600.0000 mg | ORAL_TABLET | ORAL | 0 refills | Status: AC | PRN

## 2015-12-19 MED ORDER — ONDANSETRON HCL 4 MG/2ML IJ SOLN
INTRAMUSCULAR | Status: DC | PRN
  Administered 2015-12-19: 4 mg via INTRAVENOUS

## 2015-12-19 MED ORDER — FENTANYL CITRATE (PF) 100 MCG/2ML IJ SOLN
INTRAMUSCULAR | Status: AC
  Filled 2015-12-19: qty 2

## 2015-12-19 MED ORDER — SODIUM CHLORIDE 0.9% FLUSH
3.0000 mL | Freq: Two times a day (BID) | INTRAVENOUS | Status: DC
  Filled 2015-12-19: qty 3

## 2015-12-19 MED ORDER — FENTANYL CITRATE (PF) 100 MCG/2ML IJ SOLN
INTRAMUSCULAR | Status: DC | PRN
  Administered 2015-12-19 (×2): 50 ug via INTRAVENOUS

## 2015-12-19 MED ORDER — GLYCOPYRROLATE 0.2 MG/ML IJ SOLN
INTRAMUSCULAR | Status: DC | PRN
  Administered 2015-12-19: 0.2 mg via INTRAVENOUS

## 2015-12-19 MED ORDER — LIDOCAINE 5 % EX OINT
TOPICAL_OINTMENT | CUTANEOUS | Status: DC | PRN
  Administered 2015-12-19: 1

## 2015-12-19 MED ORDER — MIDAZOLAM HCL 5 MG/5ML IJ SOLN
INTRAMUSCULAR | Status: DC | PRN
  Administered 2015-12-19: 2 mg via INTRAVENOUS

## 2015-12-19 MED ORDER — LIDOCAINE 2% (20 MG/ML) 5 ML SYRINGE
INTRAMUSCULAR | Status: AC
  Filled 2015-12-19: qty 10

## 2015-12-19 MED ORDER — LACTATED RINGERS IV SOLN
INTRAVENOUS | Status: DC
  Administered 2015-12-19: 11:00:00 via INTRAVENOUS
  Filled 2015-12-19: qty 1000

## 2015-12-19 MED ORDER — SODIUM CHLORIDE 0.9 % IV SOLN
INTRAVENOUS | Status: DC | PRN
  Administered 2015-12-19: 10 ug/kg/min via INTRAVENOUS

## 2015-12-19 MED ORDER — MIDAZOLAM HCL 2 MG/2ML IJ SOLN
INTRAMUSCULAR | Status: AC
  Filled 2015-12-19: qty 2

## 2015-12-19 MED ORDER — ACETAMINOPHEN 325 MG PO TABS
650.0000 mg | ORAL_TABLET | ORAL | Status: DC | PRN
  Filled 2015-12-19: qty 2

## 2015-12-19 MED ORDER — LIDOCAINE HCL (CARDIAC) 20 MG/ML IV SOLN
INTRAVENOUS | Status: DC | PRN
  Administered 2015-12-19: 50 mg via INTRAVENOUS

## 2015-12-19 MED ORDER — SODIUM CHLORIDE 0.9% FLUSH
3.0000 mL | INTRAVENOUS | Status: DC | PRN
  Filled 2015-12-19: qty 3

## 2015-12-19 MED ORDER — PROPOFOL 500 MG/50ML IV EMUL
INTRAVENOUS | Status: DC | PRN
  Administered 2015-12-19: 200 ug/kg/min via INTRAVENOUS

## 2015-12-19 MED ORDER — MEPERIDINE HCL 25 MG/ML IJ SOLN
6.2500 mg | INTRAMUSCULAR | Status: DC | PRN
  Filled 2015-12-19: qty 1

## 2015-12-19 MED ORDER — SODIUM CHLORIDE 0.9 % IV SOLN
250.0000 mL | INTRAVENOUS | Status: DC | PRN
  Filled 2015-12-19: qty 250

## 2015-12-19 MED ORDER — LIDOCAINE 5 % EX OINT
TOPICAL_OINTMENT | CUTANEOUS | Status: AC
  Filled 2015-12-19: qty 35.44

## 2015-12-19 SURGICAL SUPPLY — 52 items
APL SKNCLS STERI-STRIP NONHPOA (GAUZE/BANDAGES/DRESSINGS) ×1
BENZOIN TINCTURE PRP APPL 2/3 (GAUZE/BANDAGES/DRESSINGS) ×3 IMPLANT
BLADE HEX COATED 2.75 (ELECTRODE) ×3 IMPLANT
BLADE SURG 10 STRL SS (BLADE) IMPLANT
BLADE SURG 15 STRL LF DISP TIS (BLADE) ×1 IMPLANT
BLADE SURG 15 STRL SS (BLADE) ×3
BRIEF STRETCH FOR OB PAD LRG (UNDERPADS AND DIAPERS) ×3 IMPLANT
CANISTER SUCTION 2500CC (MISCELLANEOUS) ×3 IMPLANT
COVER BACK TABLE 60X90IN (DRAPES) ×3 IMPLANT
COVER MAYO STAND STRL (DRAPES) ×3 IMPLANT
DECANTER SPIKE VIAL GLASS SM (MISCELLANEOUS) ×3 IMPLANT
DRAPE LAPAROTOMY 100X72 PEDS (DRAPES) ×3 IMPLANT
DRAPE LG THREE QUARTER DISP (DRAPES) IMPLANT
DRAPE UTILITY XL STRL (DRAPES) ×3 IMPLANT
ELECT BLADE 6.5 .24CM SHAFT (ELECTRODE) IMPLANT
ELECT REM PT RETURN 9FT ADLT (ELECTROSURGICAL) ×3
ELECTRODE REM PT RTRN 9FT ADLT (ELECTROSURGICAL) ×1 IMPLANT
GAUZE SPONGE 4X4 12PLY STRL (GAUZE/BANDAGES/DRESSINGS) ×2 IMPLANT
GAUZE SPONGE 4X4 16PLY XRAY LF (GAUZE/BANDAGES/DRESSINGS) IMPLANT
GAUZE VASELINE 3X9 (GAUZE/BANDAGES/DRESSINGS) IMPLANT
GLOVE BIO SURGEON STRL SZ 6.5 (GLOVE) ×2 IMPLANT
GLOVE BIO SURGEONS STRL SZ 6.5 (GLOVE) ×1
GLOVE INDICATOR 7.0 STRL GRN (GLOVE) ×3 IMPLANT
GOWN STRL REUS W/ TWL LRG LVL3 (GOWN DISPOSABLE) ×1 IMPLANT
GOWN STRL REUS W/ TWL XL LVL3 (GOWN DISPOSABLE) ×1 IMPLANT
GOWN STRL REUS W/TWL 2XL LVL3 (GOWN DISPOSABLE) ×3 IMPLANT
GOWN STRL REUS W/TWL LRG LVL3 (GOWN DISPOSABLE) ×3
GOWN STRL REUS W/TWL XL LVL3 (GOWN DISPOSABLE) ×3
HYDROGEN PEROXIDE 16OZ (MISCELLANEOUS) IMPLANT
KIT ROOM TURNOVER WOR (KITS) ×3 IMPLANT
LOOP VESSEL MAXI BLUE (MISCELLANEOUS) ×3 IMPLANT
NDL SAFETY ECLIPSE 18X1.5 (NEEDLE) IMPLANT
NEEDLE HYPO 18GX1.5 SHARP (NEEDLE)
NEEDLE HYPO 22GX1.5 SAFETY (NEEDLE) ×3 IMPLANT
NS IRRIG 500ML POUR BTL (IV SOLUTION) ×3 IMPLANT
PACK BASIN DAY SURGERY FS (CUSTOM PROCEDURE TRAY) ×3 IMPLANT
PAD ABD 8X10 STRL (GAUZE/BANDAGES/DRESSINGS) ×3 IMPLANT
PAD ARMBOARD 7.5X6 YLW CONV (MISCELLANEOUS) ×3 IMPLANT
PENCIL BUTTON HOLSTER BLD 10FT (ELECTRODE) ×3 IMPLANT
SPONGE GAUZE 4X4 12PLY STER LF (GAUZE/BANDAGES/DRESSINGS) IMPLANT
SPONGE SURGIFOAM ABS GEL 12-7 (HEMOSTASIS) IMPLANT
SUT CHROMIC 2 0 SH (SUTURE) ×2 IMPLANT
SUT CHROMIC 3 0 SH 27 (SUTURE) IMPLANT
SUT ETHIBOND 0 (SUTURE) IMPLANT
SUT VIC AB 4-0 P-3 18XBRD (SUTURE) IMPLANT
SUT VIC AB 4-0 P3 18 (SUTURE)
SYR CONTROL 10ML LL (SYRINGE) ×3 IMPLANT
TOWEL OR 17X24 6PK STRL BLUE (TOWEL DISPOSABLE) ×3 IMPLANT
TRAY DSU PREP LF (CUSTOM PROCEDURE TRAY) ×3 IMPLANT
TUBE CONNECTING 12'X1/4 (SUCTIONS) ×1
TUBE CONNECTING 12X1/4 (SUCTIONS) ×2 IMPLANT
YANKAUER SUCT BULB TIP NO VENT (SUCTIONS) ×3 IMPLANT

## 2015-12-19 NOTE — Progress Notes (Signed)
Patient speaks a little [place] and understands a little. Niece is here acting as interpreter

## 2015-12-19 NOTE — Anesthesia Procedure Notes (Signed)
Procedure Name: MAC Date/Time: 12/19/2015 11:40 AM Performed by: Tyrone NineSAUVE, Boruch Manuele F Pre-anesthesia Checklist: Patient identified, Timeout performed, Suction available, Patient being monitored and Emergency Drugs available Patient Re-evaluated:Patient Re-evaluated prior to inductionOxygen Delivery Method: Nasal cannula Intubation Type: IV induction Placement Confirmation: CO2 detector Dental Injury: Teeth and Oropharynx as per pre-operative assessment

## 2015-12-19 NOTE — Anesthesia Preprocedure Evaluation (Signed)
Anesthesia Evaluation  Patient identified by MRN, date of birth, ID band Patient awake    Reviewed: Allergy & Precautions, NPO status , Patient's Chart, lab work & pertinent test results  Airway Mallampati: II  TM Distance: >3 FB Neck ROM: Full    Dental no notable dental hx.    Pulmonary neg pulmonary ROS,    Pulmonary exam normal breath sounds clear to auscultation       Cardiovascular negative cardio ROS Normal cardiovascular exam Rhythm:Regular Rate:Normal     Neuro/Psych negative neurological ROS  negative psych ROS   GI/Hepatic negative GI ROS, Neg liver ROS,   Endo/Other  negative endocrine ROS  Renal/GU negative Renal ROS  negative genitourinary   Musculoskeletal negative musculoskeletal ROS (+)   Abdominal   Peds negative pediatric ROS (+)  Hematology negative hematology ROS (+)   Anesthesia Other Findings   Reproductive/Obstetrics negative OB ROS                             Anesthesia Physical  Anesthesia Plan  ASA: I  Anesthesia Plan: MAC   Post-op Pain Management:    Induction:   Airway Management Planned: Simple Face Mask  Additional Equipment:   Intra-op Plan:   Post-operative Plan:   Informed Consent: I have reviewed the patients History and Physical, chart, labs and discussed the procedure including the risks, benefits and alternatives for the proposed anesthesia with the patient or authorized representative who has indicated his/her understanding and acceptance.   Dental advisory given  Plan Discussed with: CRNA and Surgeon  Anesthesia Plan Comments:         Anesthesia Quick Evaluation

## 2015-12-19 NOTE — Op Note (Signed)
12/19/2015  12:06 PM  PATIENT:  Nathan Hickman  45 y.o. male  Patient Care Team: No Pcp Per Patient as PCP - General (General Practice)  PRE-OPERATIVE DIAGNOSIS:  ANAL FISTULA  POST-OPERATIVE DIAGNOSIS:  ANAL FISTULA  PROCEDURE:  ANAL EXAM UNDER ANESTHESIA, FISTULOTOMY  SURGEON:  Surgeon(s): Romie LeveeAlicia Myiah Petkus, MD  ASSISTANT: none   ANESTHESIA:   local and MAC  SPECIMEN:  No Specimen  DISPOSITION OF SPECIMEN:  N/A  COUNTS:  YES  PLAN OF CARE: Discharge to home after PACU  PATIENT DISPOSITION:  PACU - hemodynamically stable.  INDICATION: 45 year old male who presented to the office with complaints of chronic rectal drainage after abscess I&D. His physical exam was concerning for anal fistula. It was recommended that he proceed to the operating room for exam under anesthesia and possible fistulotomy versus seton placement   OR FINDINGS: Small intersphincteric anal fistula, posterior midline  DESCRIPTION: the patient was identified in the preoperative holding area and taken to the OR where they were laid on the operating room table.  MAC anesthesia was induced without difficulty. The patient was then positioned in prone jackknife position with buttocks gently taped apart.  The patient was then prepped and draped in usual sterile fashion.  SCDs were noted to be in place prior to the initiation of anesthesia. A surgical timeout was performed indicating the correct patient, procedure, positioning and need for preoperative antibiotics.  A rectal block was performed using Marcaine with epinephrine.    I began with a digital rectal exam.  There were no internal masses palpated  I then placed a Hill-Ferguson anoscope into the anal canal and evaluated this completely.  The anal canal was significant for spector hypertension. There was minimal hemorrhoid disease. I was able to place an S shaped fistula probe into the external opening. This exited at the dentate line. There was only a small  amount of internal sphincter overlying the fistula tract. A fistulotomy was performed using electrocautery. The edges were marsupialized using a 3-0 chromic suture. A sterile dressing was placed. The patient was awakened from anesthesia and sent to the postanesthesia care unit in stable condition. All counts were correct per the OR staff.

## 2015-12-19 NOTE — H&P (Signed)
  History of Present Illness Nathan Hickman(Nathan Dieckman MD; 12/02/2015 2:18 PM) The patient is a 45 year old male who presents with anal pain. Status post incision and drainage of a perirectal abscess on 10/05/15. The wound was 2 x 2.5 cm deep. He was discharged home with dressing changes on postoperative day #1. He was doing quite well with minimal tenderness. Since that visit, he has developed increasing perianal tenderness and has persistent drainage from this area. Bowel movements are normal, with no diarrhea or constipation. No melena or hematochezia. He is not able to work because of the discomfort.   Problem List/Past Medical Nathan Hickman(Nathan Hannon, MD; 12/02/2015 2:18 PM) PERIRECTAL ABSCESS (K61.1) ANAL FISTULA (K60.3)  Allergies Nathan Hickman(Nathan Dockery, LPN; 12/45/809911/13/2017 1:58 PM) No Known Drug Allergies10/09/2015  Medication History Nathan Hickman(Nathan Dockery, LPN; 83/38/250511/13/2017 1:58 PM) OxyCODONE HCl (5MG  Tablet, Oral) Active. Colace (100MG  Capsule, Oral) Active. Advil (200MG  Capsule, Oral) Active. Lidocaine HCl (2% Gel, External) Active. Medications Reconciled  Past Surgical History:  Procedure Laterality Date  . INCISION AND DRAINAGE PERIRECTAL ABSCESS N/A 10/05/2015   Procedure: IRRIGATION AND DEBRIDEMENT PERIRECTAL ABSCESS;  Surgeon: Manus RuddMatthew Tsuei, MD;  Location: [facility] OR;  Service: General;  Laterality: N/A;   Social History   Social History  . Marital status: Married    Spouse name: N/A  . Number of children: N/A  . Years of education: N/A   Occupational History  . Not on file.   Social History Main Topics  . Smoking status: Never Smoker  . Smokeless tobacco: Never Used  . Alcohol use No  . Drug use: No  . Sexual activity: Not on file   Other Topics Concern  . Not on file   Social History Narrative  . No narrative on file   History reviewed. No pertinent family history.  BP 122/71   Pulse 71   Temp 98.2 F (36.8 C) (Oral)   Resp 16   Ht 5\' 8"  (1.727 m)   Wt 94.3 kg (208 lb)   SpO2  99%   BMI 31.63 kg/m    Exam: Gen: NAD CV: RRR Lungs: CTA Abd: soft  Physical Exam Nathan Hickman(Nathan Thatch MD; 12/02/2015 2:22 PM) The physical exam findings are as follows:  Right perianal - 1 cm area of granulation tissue with some purulent drainage in right anterior position; no fluctance noted    Assessment & Plan Nathan Hickman(Nathan Hazelrigg MD; 12/02/2015 2:16 PM) ANAL FISTULA (K60.3) Impression: 45 year old male status post drainage of a perirectal abscess. He continues to have perianal drainage. On exam he appears to have perianal fistula. I have recommended an exam under anesthesia. We will either do a fistulotomy or seton placement depending on the depth of this fistula.  We discussed that there is a small chance of incontinence with fistulotomy.  We discussed the need for additional surgery if a seton is placed.

## 2015-12-19 NOTE — Anesthesia Postprocedure Evaluation (Signed)
Anesthesia Post Note  Patient: Nathan Hickman  Procedure(s) Performed: Procedure(s) (LRB): ANAL EXAM UNDER ANESTHESIA, POSSIBLE FISTULA VS SETON (N/A)  Patient location during evaluation: PACU Anesthesia Type: General Level of consciousness: awake and alert Pain management: pain level controlled Vital Signs Assessment: post-procedure vital signs reviewed and stable Respiratory status: spontaneous breathing, nonlabored ventilation, respiratory function stable and patient connected to nasal cannula oxygen Cardiovascular status: blood pressure returned to baseline and stable Postop Assessment: no signs of nausea or vomiting Anesthetic complications: no    Last Vitals:  Vitals:   12/19/15 1245 12/19/15 1300  BP: 129/72 125/74  Pulse: 70 65  Resp: 14 14  Temp:      Last Pain:  Vitals:   12/19/15 1300  TempSrc:   PainSc: Asleep                 Phillips Groutarignan, Leeza Heiner

## 2015-12-19 NOTE — Transfer of Care (Signed)
Immediate Anesthesia Transfer of Care Note  Patient: Nathan Hickman  Procedure(s) Performed: Procedure(s): ANAL EXAM UNDER ANESTHESIA, POSSIBLE FISTULA VS SETON (N/A)  Patient Location: PACU  Anesthesia Type:MAC  Level of Consciousness: awake, alert , oriented and patient cooperative  Airway & Oxygen Therapy: Patient Spontanous Breathing and Patient connected to nasal cannula oxygen  Post-op Assessment: Report given to RN and Post -op Vital signs reviewed and stable  Post vital signs: Reviewed and stable  Last Vitals:  Vitals:   12/19/15 0947  BP: 122/71  Pulse: 71  Resp: 16  Temp: 36.8 C    Last Pain:  Vitals:   12/19/15 0947  TempSrc: Oral      Patients Stated Pain Goal: 5 (12/19/15 1020)  Complications: No apparent anesthesia complications

## 2015-12-19 NOTE — Discharge Instructions (Addendum)

## 2015-12-20 ENCOUNTER — Encounter (HOSPITAL_BASED_OUTPATIENT_CLINIC_OR_DEPARTMENT_OTHER): Payer: Self-pay | Admitting: General Surgery

## 2021-03-11 ENCOUNTER — Other Ambulatory Visit: Payer: Self-pay | Admitting: Nurse Practitioner

## 2021-03-11 ENCOUNTER — Other Ambulatory Visit: Payer: Self-pay

## 2021-03-11 ENCOUNTER — Ambulatory Visit
Admission: RE | Admit: 2021-03-11 | Discharge: 2021-03-11 | Disposition: A | Discharge status: Home / Self Care | Payer: Self-pay | Source: Ambulatory Visit | Attending: Nurse Practitioner | Admitting: Nurse Practitioner

## 2021-03-11 DIAGNOSIS — M25512 Pain in left shoulder: Secondary | ICD-10-CM
